# Patient Record
Sex: Female | Born: 1955 | Race: White | Hispanic: No | Marital: Married | Smoking: Never smoker
Health system: Southern US, Community
[De-identification: ages and names within clinical notes are randomized; demographics above are authoritative.]

## PROBLEM LIST (undated history)

## (undated) DIAGNOSIS — K219 Gastro-esophageal reflux disease without esophagitis: Secondary | ICD-10-CM

## (undated) DIAGNOSIS — K5909 Other constipation: Secondary | ICD-10-CM

## (undated) HISTORY — DX: Other constipation: K59.09

## (undated) HISTORY — DX: Gastro-esophageal reflux disease without esophagitis: K21.9

## (undated) HISTORY — PX: CATARACT EXTRACTION: SUR2

---

## 2012-06-23 ENCOUNTER — Encounter (INDEPENDENT_AMBULATORY_CARE_PROVIDER_SITE_OTHER): Payer: Self-pay | Admitting: *Deleted

## 2012-06-24 ENCOUNTER — Encounter (INDEPENDENT_AMBULATORY_CARE_PROVIDER_SITE_OTHER): Payer: Self-pay

## 2019-02-27 ENCOUNTER — Other Ambulatory Visit: Payer: Self-pay

## 2019-02-27 NOTE — Patient Outreach (Signed)
Health Risk Assessment- late entry Referral source: insurance plan    02/15/2019  Placed call to patient. Reviewed reason for call. Reviewed health risk survey which patient completed at time of enrollement: Patient reports that she is doing well and has no needs at this time.  PLAN: Will mail successful outreach letter Tomasa Rand, RN, BSN, CEN [facility] [phone]

## 2019-03-10 DIAGNOSIS — E782 Mixed hyperlipidemia: Secondary | ICD-10-CM | POA: Diagnosis not present

## 2019-03-10 DIAGNOSIS — K219 Gastro-esophageal reflux disease without esophagitis: Secondary | ICD-10-CM | POA: Diagnosis not present

## 2019-03-16 DIAGNOSIS — F329 Major depressive disorder, single episode, unspecified: Secondary | ICD-10-CM | POA: Diagnosis not present

## 2019-03-16 DIAGNOSIS — F411 Generalized anxiety disorder: Secondary | ICD-10-CM | POA: Diagnosis not present

## 2019-03-16 DIAGNOSIS — R300 Dysuria: Secondary | ICD-10-CM | POA: Diagnosis not present

## 2019-03-16 DIAGNOSIS — E7800 Pure hypercholesterolemia, unspecified: Secondary | ICD-10-CM | POA: Diagnosis not present

## 2019-03-16 DIAGNOSIS — Z6824 Body mass index (BMI) 24.0-24.9, adult: Secondary | ICD-10-CM | POA: Diagnosis not present

## 2019-03-16 DIAGNOSIS — E782 Mixed hyperlipidemia: Secondary | ICD-10-CM | POA: Diagnosis not present

## 2019-03-16 DIAGNOSIS — Z0000 Encounter for general adult medical examination without abnormal findings: Secondary | ICD-10-CM | POA: Diagnosis not present

## 2019-03-16 DIAGNOSIS — Z23 Encounter for immunization: Secondary | ICD-10-CM | POA: Diagnosis not present

## 2019-03-20 ENCOUNTER — Other Ambulatory Visit (HOSPITAL_COMMUNITY): Payer: Self-pay | Admitting: Family Medicine

## 2019-03-20 ENCOUNTER — Other Ambulatory Visit: Payer: Self-pay | Admitting: Family Medicine

## 2019-03-20 DIAGNOSIS — R102 Pelvic and perineal pain: Secondary | ICD-10-CM

## 2019-03-24 ENCOUNTER — Ambulatory Visit (HOSPITAL_COMMUNITY)
Admission: RE | Admit: 2019-03-24 | Discharge: 2019-03-24 | Disposition: A | Discharge status: Home / Self Care | Payer: PPO | Source: Ambulatory Visit | Attending: Family Medicine | Admitting: Family Medicine

## 2019-03-24 ENCOUNTER — Other Ambulatory Visit: Payer: Self-pay

## 2019-03-24 DIAGNOSIS — R102 Pelvic and perineal pain: Secondary | ICD-10-CM | POA: Diagnosis not present

## 2019-03-24 DIAGNOSIS — N83291 Other ovarian cyst, right side: Secondary | ICD-10-CM | POA: Diagnosis not present

## 2019-03-27 DIAGNOSIS — Z1211 Encounter for screening for malignant neoplasm of colon: Secondary | ICD-10-CM | POA: Diagnosis not present

## 2019-03-27 DIAGNOSIS — Z1212 Encounter for screening for malignant neoplasm of rectum: Secondary | ICD-10-CM | POA: Diagnosis not present

## 2019-04-05 ENCOUNTER — Other Ambulatory Visit: Payer: Self-pay

## 2019-04-05 NOTE — Patient Outreach (Addendum)
Late entry: HTA heath risk assessment/screening:  02/15/2019  See note from 02/27/2019.  Directed to add patient into HRA engagement program from Surveyor, quantity.   PLAN: mail welcome letter. Will call back in 6 months for follow up. Due 08/2019  Tomasa Rand, RN, BSN, CEN [facility] [employer] [phone]

## 2019-04-18 ENCOUNTER — Ambulatory Visit (HOSPITAL_COMMUNITY): Payer: Self-pay

## 2019-04-24 ENCOUNTER — Other Ambulatory Visit (HOSPITAL_COMMUNITY): Payer: Self-pay | Admitting: Family Medicine

## 2019-04-24 DIAGNOSIS — M810 Age-related osteoporosis without current pathological fracture: Secondary | ICD-10-CM

## 2019-05-04 ENCOUNTER — Other Ambulatory Visit: Payer: Self-pay

## 2019-05-04 NOTE — Patient Outreach (Signed)
Health Team Advantage:  Patient transitioned to [facility].  Case closed.  PLAN: close case and send MD letter.  Tomasa Rand, RN, BSN, CEN [facility] [employer] [phone]

## 2019-07-11 DIAGNOSIS — H1031 Unspecified acute conjunctivitis, right eye: Secondary | ICD-10-CM | POA: Diagnosis not present

## 2019-08-14 DIAGNOSIS — H25812 Combined forms of age-related cataract, left eye: Secondary | ICD-10-CM | POA: Diagnosis not present

## 2019-08-14 DIAGNOSIS — H2511 Age-related nuclear cataract, right eye: Secondary | ICD-10-CM | POA: Diagnosis not present

## 2019-08-17 ENCOUNTER — Ambulatory Visit: Payer: PPO

## 2019-08-18 DIAGNOSIS — H2512 Age-related nuclear cataract, left eye: Secondary | ICD-10-CM | POA: Diagnosis not present

## 2019-08-18 DIAGNOSIS — H25812 Combined forms of age-related cataract, left eye: Secondary | ICD-10-CM | POA: Diagnosis not present

## 2019-08-23 DIAGNOSIS — Z23 Encounter for immunization: Secondary | ICD-10-CM | POA: Diagnosis not present

## 2019-09-29 DIAGNOSIS — H59022 Cataract (lens) fragments in eye following cataract surgery, left eye: Secondary | ICD-10-CM | POA: Diagnosis not present

## 2019-09-29 DIAGNOSIS — H33301 Unspecified retinal break, right eye: Secondary | ICD-10-CM | POA: Diagnosis not present

## 2019-09-29 DIAGNOSIS — H2189 Other specified disorders of iris and ciliary body: Secondary | ICD-10-CM | POA: Diagnosis not present

## 2019-09-29 DIAGNOSIS — H33311 Horseshoe tear of retina without detachment, right eye: Secondary | ICD-10-CM | POA: Diagnosis not present

## 2019-09-29 DIAGNOSIS — H2511 Age-related nuclear cataract, right eye: Secondary | ICD-10-CM | POA: Diagnosis not present

## 2019-09-29 DIAGNOSIS — H25811 Combined forms of age-related cataract, right eye: Secondary | ICD-10-CM | POA: Diagnosis not present

## 2019-09-29 DIAGNOSIS — H44511 Absolute glaucoma, right eye: Secondary | ICD-10-CM | POA: Diagnosis not present

## 2019-09-29 DIAGNOSIS — H40831 Aqueous misdirection, right eye: Secondary | ICD-10-CM | POA: Diagnosis not present

## 2019-09-30 DIAGNOSIS — H4311 Vitreous hemorrhage, right eye: Secondary | ICD-10-CM | POA: Diagnosis not present

## 2019-10-02 DIAGNOSIS — H4311 Vitreous hemorrhage, right eye: Secondary | ICD-10-CM | POA: Diagnosis not present

## 2019-12-11 DIAGNOSIS — H532 Diplopia: Secondary | ICD-10-CM | POA: Diagnosis not present

## 2019-12-11 DIAGNOSIS — H2181 Floppy iris syndrome: Secondary | ICD-10-CM | POA: Diagnosis not present

## 2019-12-11 DIAGNOSIS — Z9841 Cataract extraction status, right eye: Secondary | ICD-10-CM | POA: Diagnosis not present

## 2019-12-11 DIAGNOSIS — Z961 Presence of intraocular lens: Secondary | ICD-10-CM | POA: Diagnosis not present

## 2019-12-11 DIAGNOSIS — Z9842 Cataract extraction status, left eye: Secondary | ICD-10-CM | POA: Diagnosis not present

## 2019-12-28 DIAGNOSIS — Z20822 Contact with and (suspected) exposure to covid-19: Secondary | ICD-10-CM | POA: Diagnosis not present

## 2019-12-28 DIAGNOSIS — Z01812 Encounter for preprocedural laboratory examination: Secondary | ICD-10-CM | POA: Diagnosis not present

## 2020-01-02 DIAGNOSIS — H1589 Other disorders of sclera: Secondary | ICD-10-CM | POA: Diagnosis not present

## 2020-01-02 DIAGNOSIS — T8522XA Displacement of intraocular lens, initial encounter: Secondary | ICD-10-CM | POA: Diagnosis not present

## 2020-01-02 DIAGNOSIS — Z9841 Cataract extraction status, right eye: Secondary | ICD-10-CM | POA: Diagnosis not present

## 2020-01-02 DIAGNOSIS — Z961 Presence of intraocular lens: Secondary | ICD-10-CM | POA: Diagnosis not present

## 2020-01-02 DIAGNOSIS — T85328A Displacement of other ocular prosthetic devices, implants and grafts, initial encounter: Secondary | ICD-10-CM | POA: Diagnosis not present

## 2020-01-09 IMAGING — US US PELVIS COMPLETE WITH TRANSVAGINAL
1 series · 13 of 25 positions shown · non-contrast
Comparison: None

CLINICAL DATA: Initial evaluation for pelvic pain with burning and
pressure for 4 weeks. History of prior left oophorectomy and tubal
ligation. Prior C-section.



[Series 1: us pelvis complete with transvaginal · 0.21mm/px · 13 of 114 slices shown]
[im 1/114]
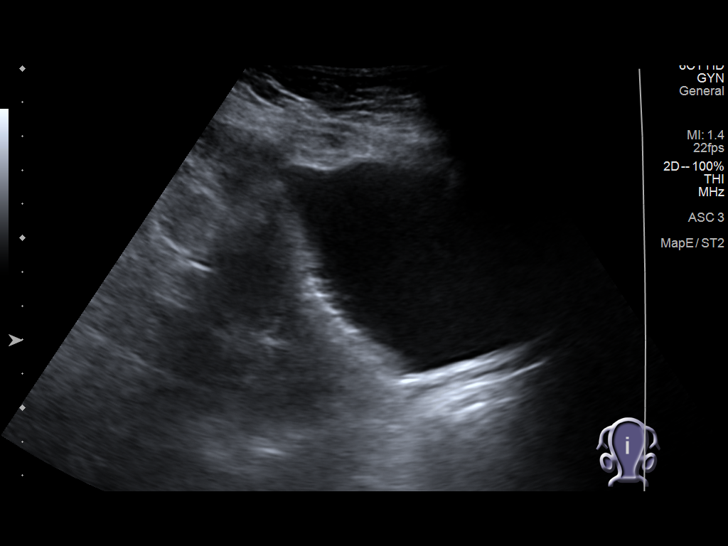
[im 10/114]
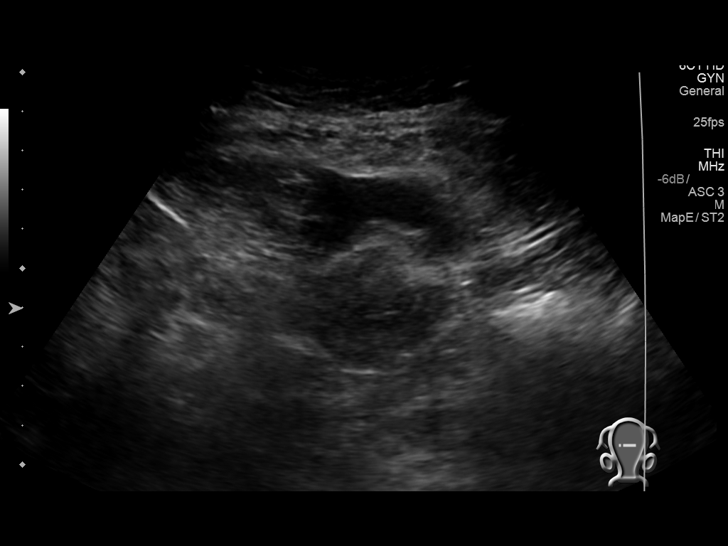
[im 19/114]
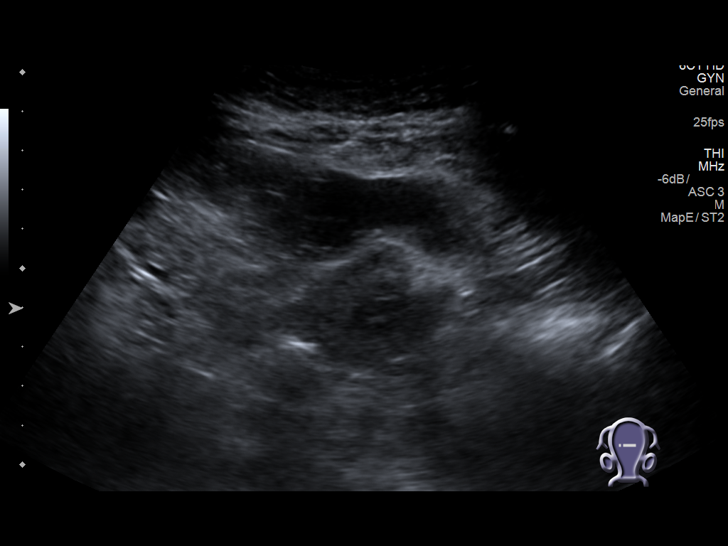
[im 29/114]
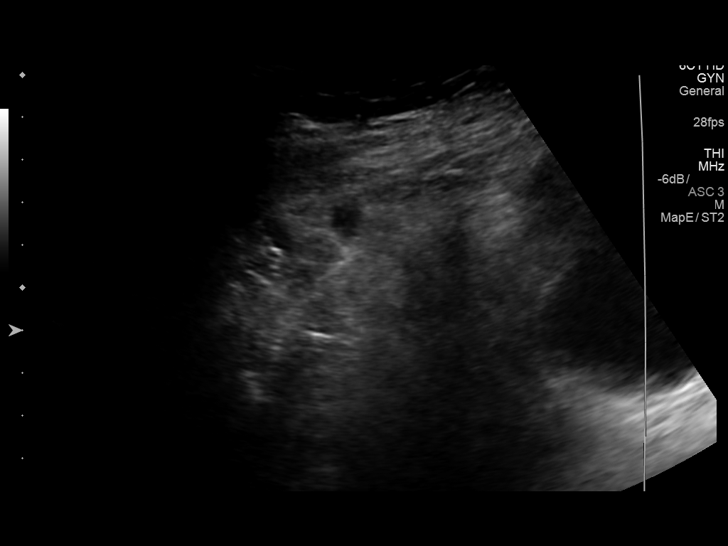
[im 38/114]
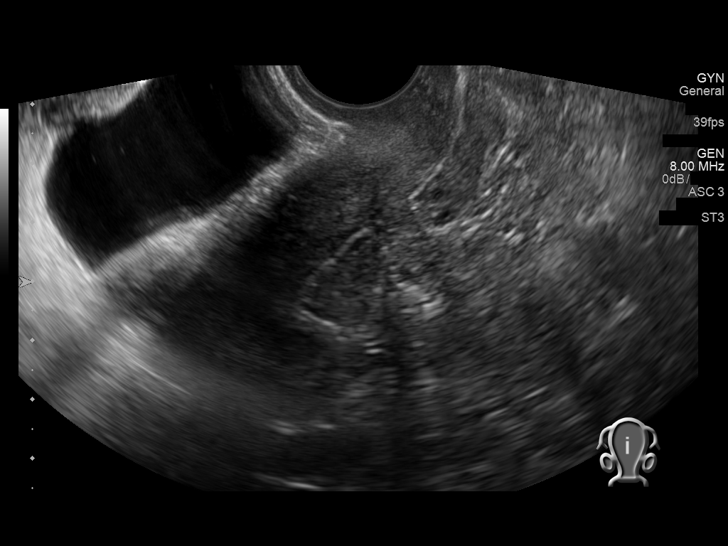
[im 48/114]
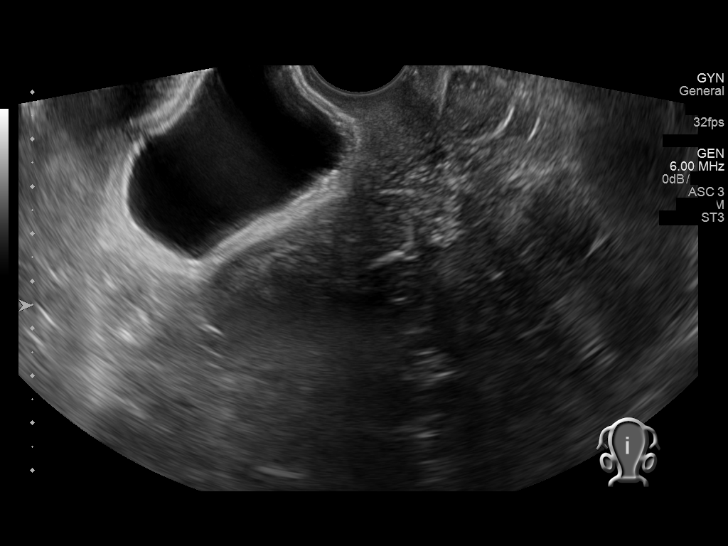
[im 57/114]
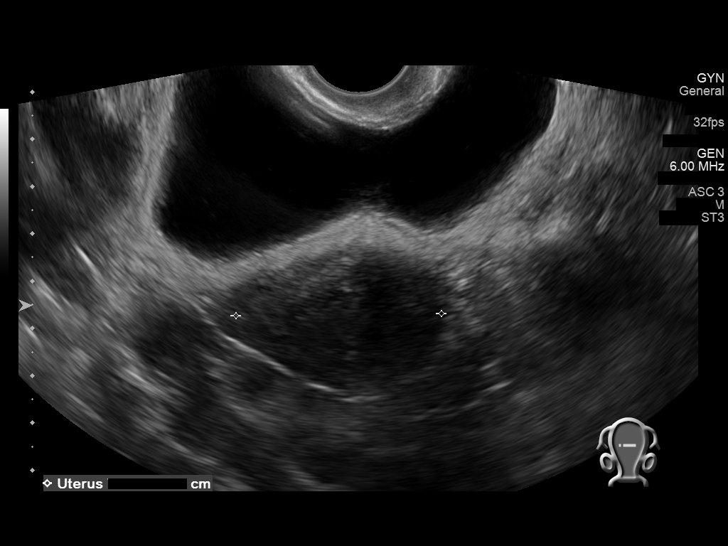
[im 66/114]
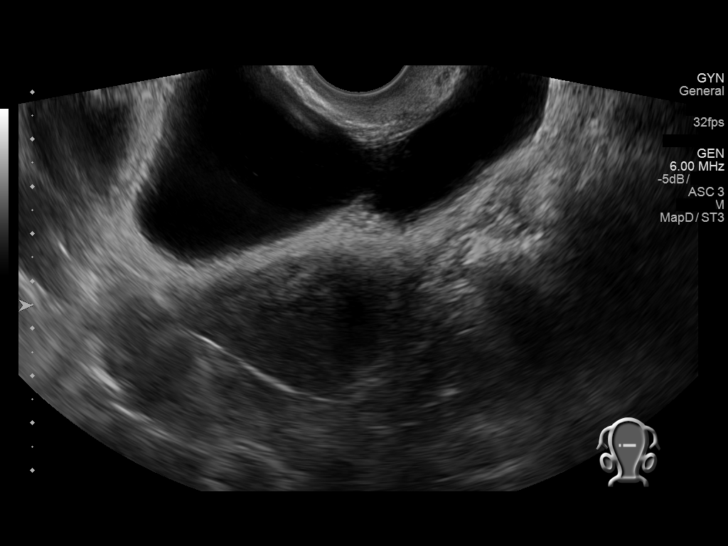
[im 76/114]
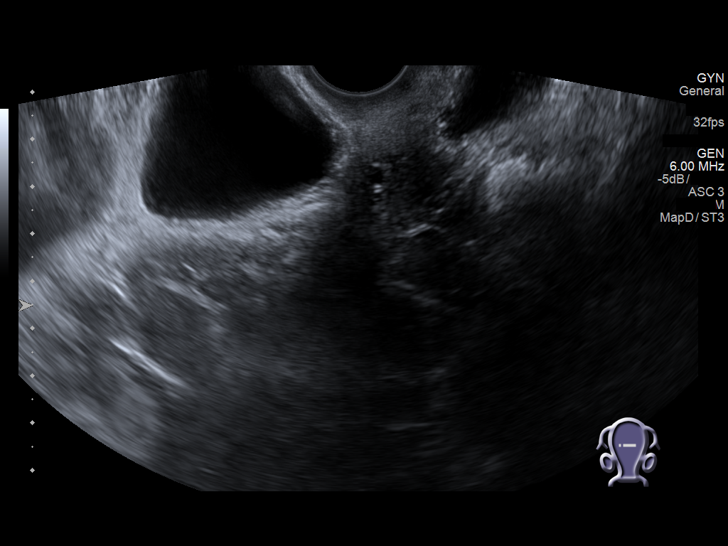
[im 85/114]
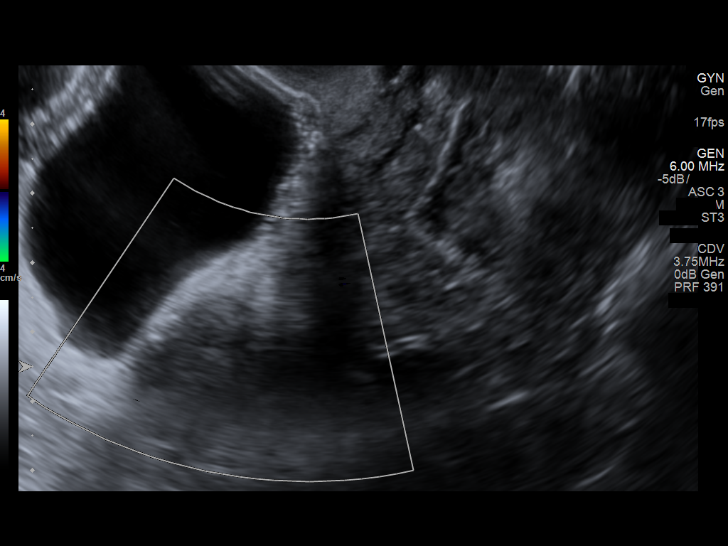
[im 95/114]
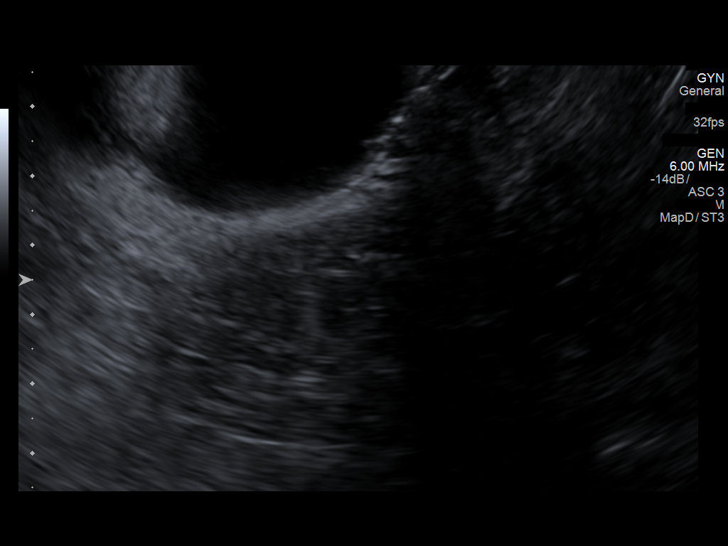
[im 104/114]
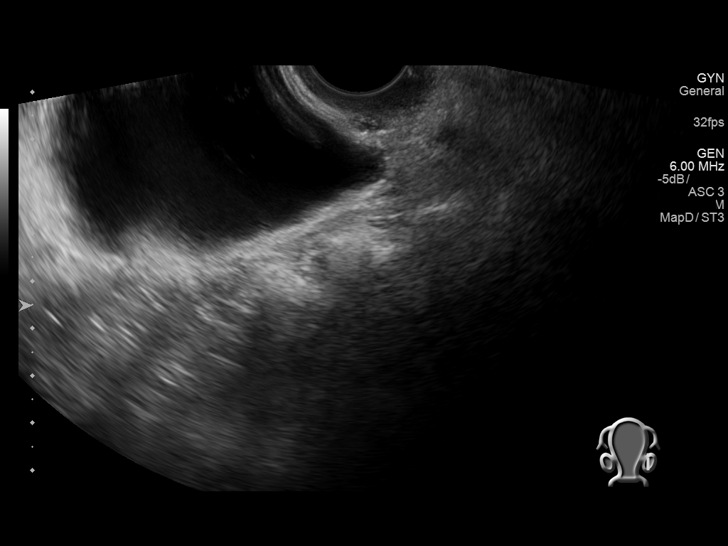
[im 114/114]
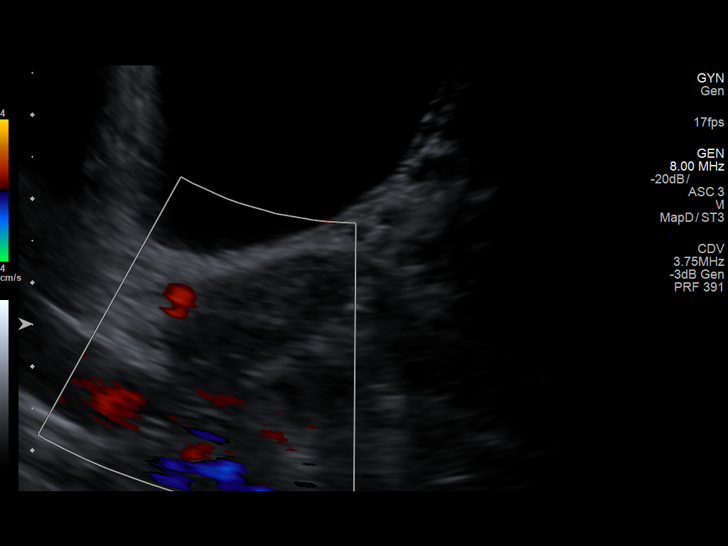

[13 of 25 positions shown; findings below may reference images not displayed]

FINDINGS: Uterus

Measurements: 6.6 x 3.3 x 4.5 cm = volume: 52.2 mL. No fibroids or
other mass visualized.

Endometrium

Thickness: 2 mm.  No focal abnormality visualized.

Right ovary

Measurements: 2.0 x 1.6 x 1.6 cm = volume: 2.6 mL. 8 x 5 x 7 mm
simple cyst. No significant internal complexity or vascularity.

Left ovary

Surgically absent and not visualized.  No adnexal mass.

Other findings

No abnormal free fluid.
IMPRESSION: 1. 8 mm simple right ovarian cyst, felt to be benign in nature given
appearance and size. No follow-up imaging recommended regarding this
lesion. No other adnexal or pelvic mass identified. No free fluid.
2. Normal sonographic appearance of the uterus and endometrium.
3. Prior left oophorectomy.

## 2020-01-10 DIAGNOSIS — Z961 Presence of intraocular lens: Secondary | ICD-10-CM | POA: Diagnosis not present

## 2020-01-10 DIAGNOSIS — Z48810 Encounter for surgical aftercare following surgery on the sense organs: Secondary | ICD-10-CM | POA: Diagnosis not present

## 2020-01-10 DIAGNOSIS — Z9889 Other specified postprocedural states: Secondary | ICD-10-CM | POA: Diagnosis not present

## 2020-01-31 DIAGNOSIS — Z9889 Other specified postprocedural states: Secondary | ICD-10-CM | POA: Diagnosis not present

## 2020-01-31 DIAGNOSIS — Z48810 Encounter for surgical aftercare following surgery on the sense organs: Secondary | ICD-10-CM | POA: Diagnosis not present

## 2020-01-31 DIAGNOSIS — Z961 Presence of intraocular lens: Secondary | ICD-10-CM | POA: Diagnosis not present

## 2020-02-28 DIAGNOSIS — Z961 Presence of intraocular lens: Secondary | ICD-10-CM | POA: Diagnosis not present

## 2020-02-28 DIAGNOSIS — Z48810 Encounter for surgical aftercare following surgery on the sense organs: Secondary | ICD-10-CM | POA: Diagnosis not present

## 2020-02-28 DIAGNOSIS — Z9889 Other specified postprocedural states: Secondary | ICD-10-CM | POA: Diagnosis not present

## 2020-02-28 DIAGNOSIS — Z9842 Cataract extraction status, left eye: Secondary | ICD-10-CM | POA: Diagnosis not present

## 2020-02-28 DIAGNOSIS — Z9841 Cataract extraction status, right eye: Secondary | ICD-10-CM | POA: Diagnosis not present

## 2020-03-04 DIAGNOSIS — L259 Unspecified contact dermatitis, unspecified cause: Secondary | ICD-10-CM | POA: Diagnosis not present

## 2020-03-08 DIAGNOSIS — R5383 Other fatigue: Secondary | ICD-10-CM | POA: Diagnosis not present

## 2020-03-08 DIAGNOSIS — R1013 Epigastric pain: Secondary | ICD-10-CM | POA: Diagnosis not present

## 2020-03-08 DIAGNOSIS — E782 Mixed hyperlipidemia: Secondary | ICD-10-CM | POA: Diagnosis not present

## 2020-03-08 DIAGNOSIS — K219 Gastro-esophageal reflux disease without esophagitis: Secondary | ICD-10-CM | POA: Diagnosis not present

## 2020-03-19 ENCOUNTER — Other Ambulatory Visit (HOSPITAL_COMMUNITY): Payer: Self-pay | Admitting: Family Medicine

## 2020-03-19 DIAGNOSIS — K219 Gastro-esophageal reflux disease without esophagitis: Secondary | ICD-10-CM | POA: Diagnosis not present

## 2020-03-19 DIAGNOSIS — Z23 Encounter for immunization: Secondary | ICD-10-CM | POA: Diagnosis not present

## 2020-03-19 DIAGNOSIS — E782 Mixed hyperlipidemia: Secondary | ICD-10-CM | POA: Diagnosis not present

## 2020-03-19 DIAGNOSIS — Z6824 Body mass index (BMI) 24.0-24.9, adult: Secondary | ICD-10-CM | POA: Diagnosis not present

## 2020-03-19 DIAGNOSIS — Z0000 Encounter for general adult medical examination without abnormal findings: Secondary | ICD-10-CM | POA: Diagnosis not present

## 2020-03-19 DIAGNOSIS — Z1231 Encounter for screening mammogram for malignant neoplasm of breast: Secondary | ICD-10-CM

## 2020-03-19 DIAGNOSIS — F411 Generalized anxiety disorder: Secondary | ICD-10-CM | POA: Diagnosis not present

## 2020-03-21 DIAGNOSIS — H35371 Puckering of macula, right eye: Secondary | ICD-10-CM | POA: Diagnosis not present

## 2020-03-21 DIAGNOSIS — H31091 Other chorioretinal scars, right eye: Secondary | ICD-10-CM | POA: Diagnosis not present

## 2020-03-21 DIAGNOSIS — H43812 Vitreous degeneration, left eye: Secondary | ICD-10-CM | POA: Diagnosis not present

## 2020-04-03 ENCOUNTER — Ambulatory Visit (HOSPITAL_COMMUNITY)
Admission: RE | Admit: 2020-04-03 | Discharge: 2020-04-03 | Disposition: A | Discharge status: Home / Self Care | Payer: PPO | Source: Ambulatory Visit | Attending: Family Medicine | Admitting: Family Medicine

## 2020-04-03 ENCOUNTER — Other Ambulatory Visit: Payer: Self-pay

## 2020-04-03 DIAGNOSIS — Z1231 Encounter for screening mammogram for malignant neoplasm of breast: Secondary | ICD-10-CM | POA: Insufficient documentation

## 2020-04-03 IMAGING — MG DIGITAL SCREENING BILAT W/ TOMO W/ CAD
8 series · 9 of 24 positions shown · non-contrast
Comparison: Previous exam(s).

CLINICAL DATA: Screening.

EXAM:
DIGITAL SCREENING BILATERAL MAMMOGRAM WITH TOMO AND CAD

[R CC synth-2D]
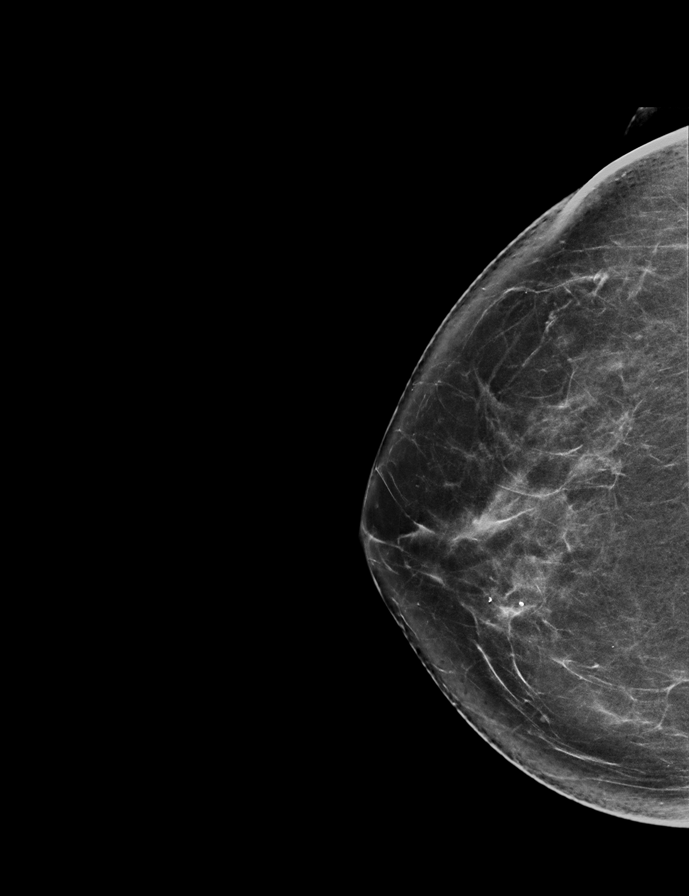

[R MLO synth-2D]
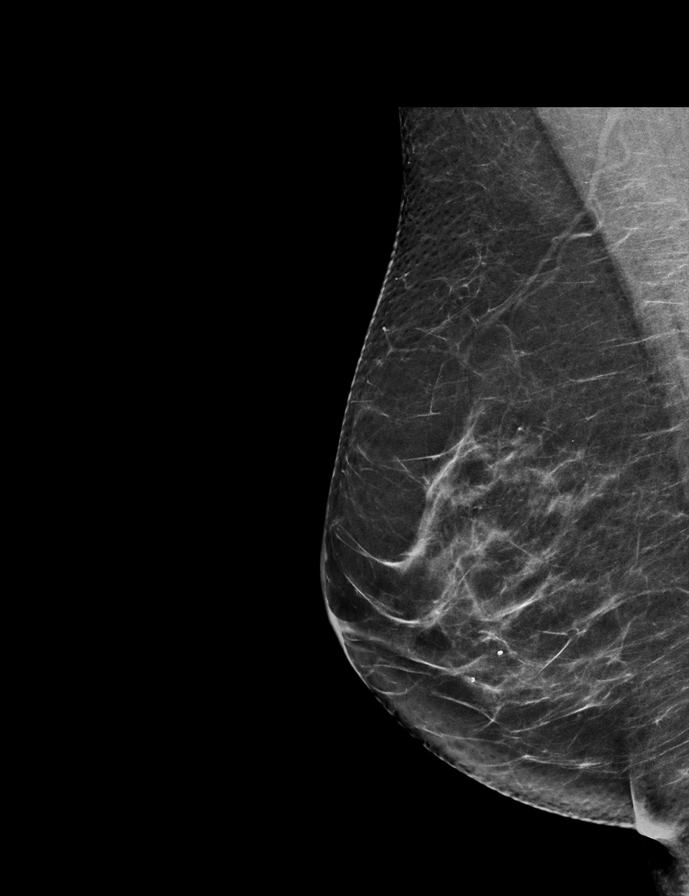

[L MLO synth-2D]
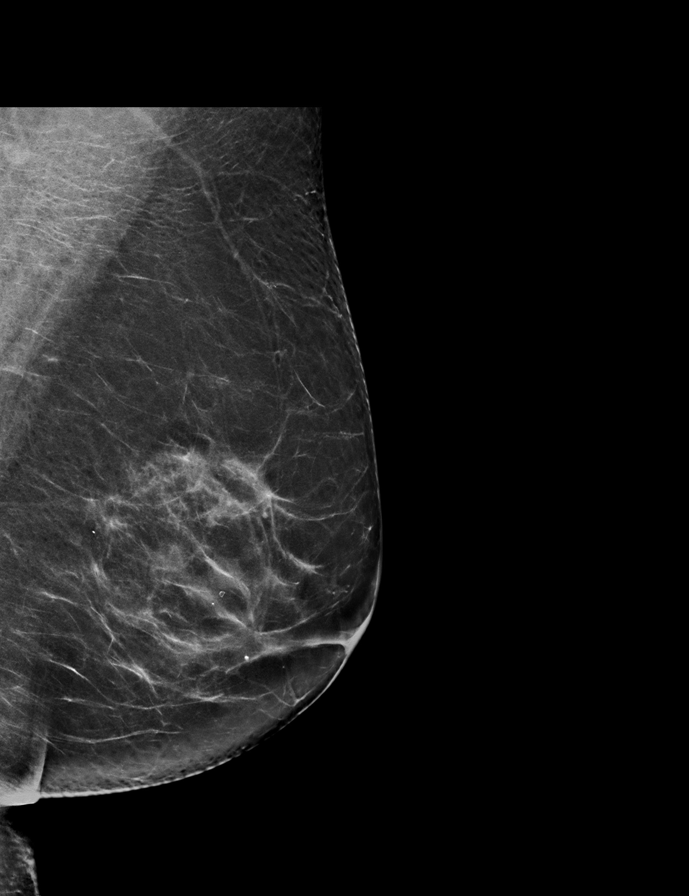

[L CC synth-2D]
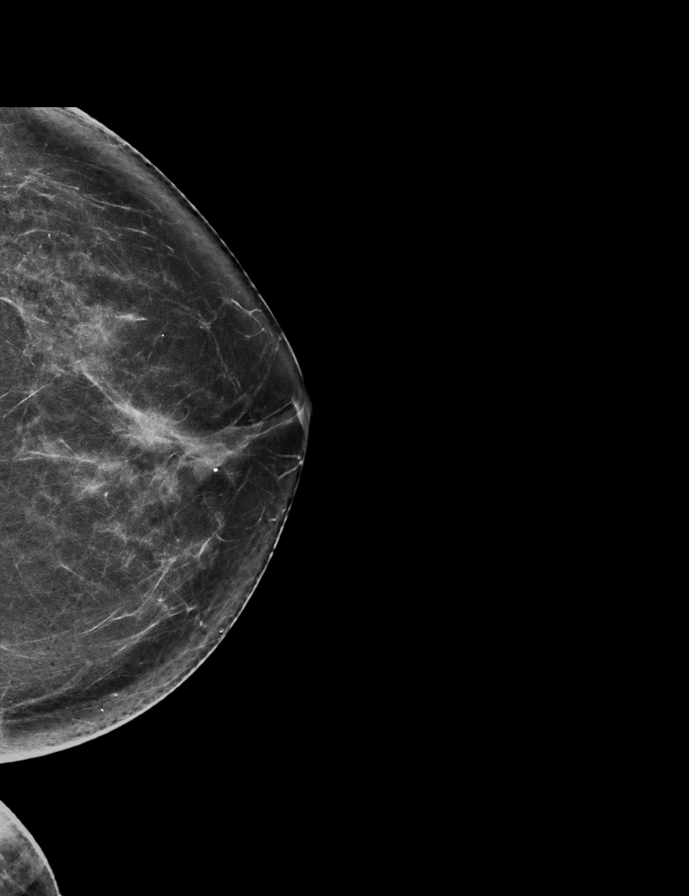

[R MLO tomo · 2 of 68 frames shown]
[frame 22/68]
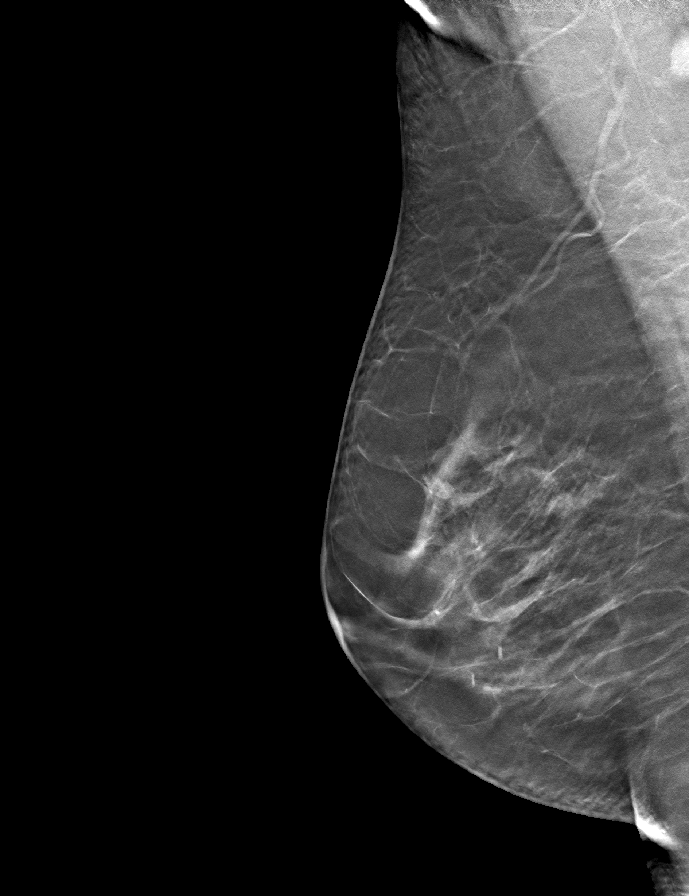
[frame 35/68]
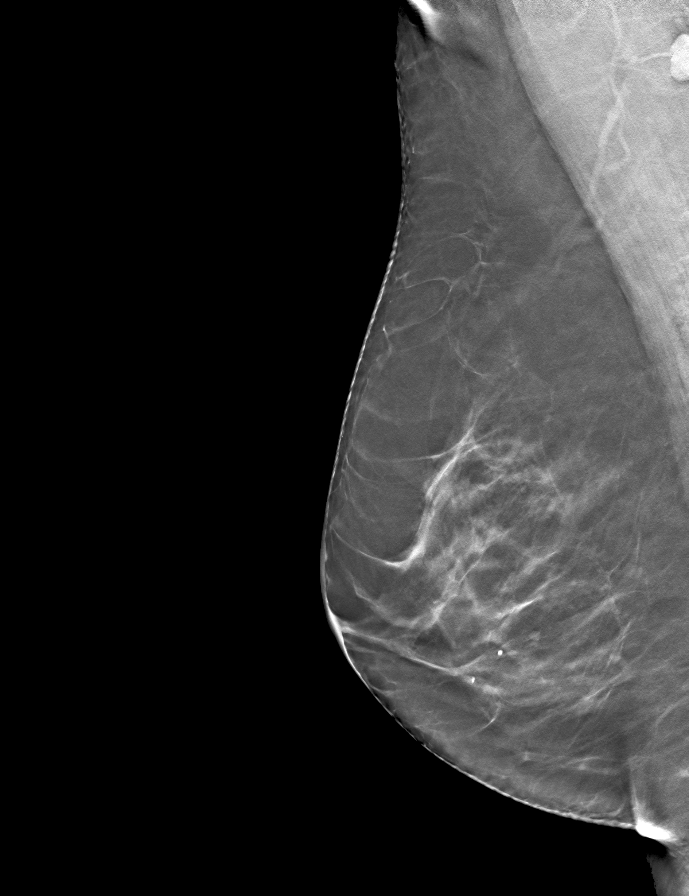

[R CC tomo · tomo slice 37/73.0]
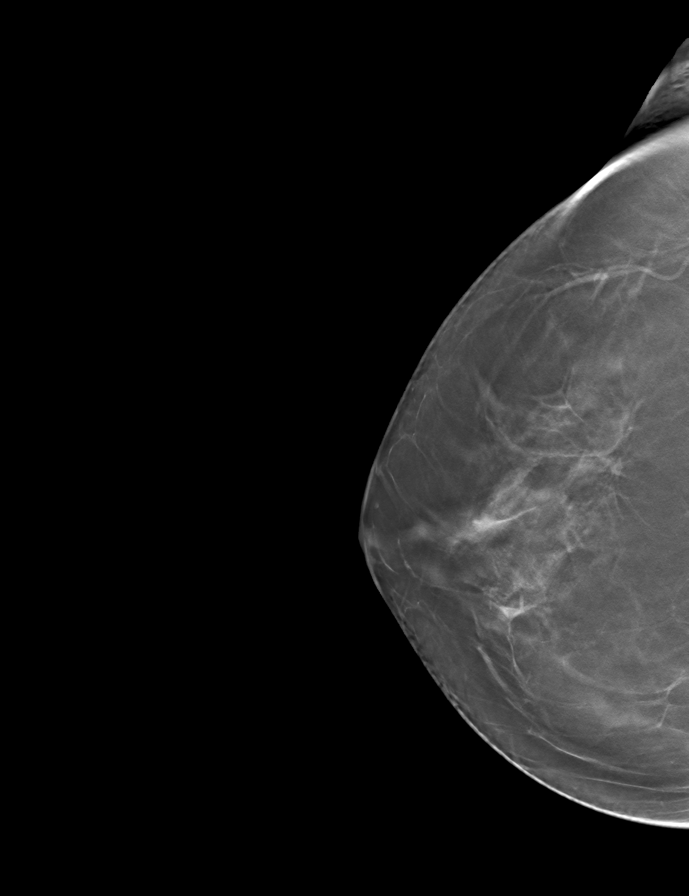

[L MLO tomo · tomo slice 37/73.0]
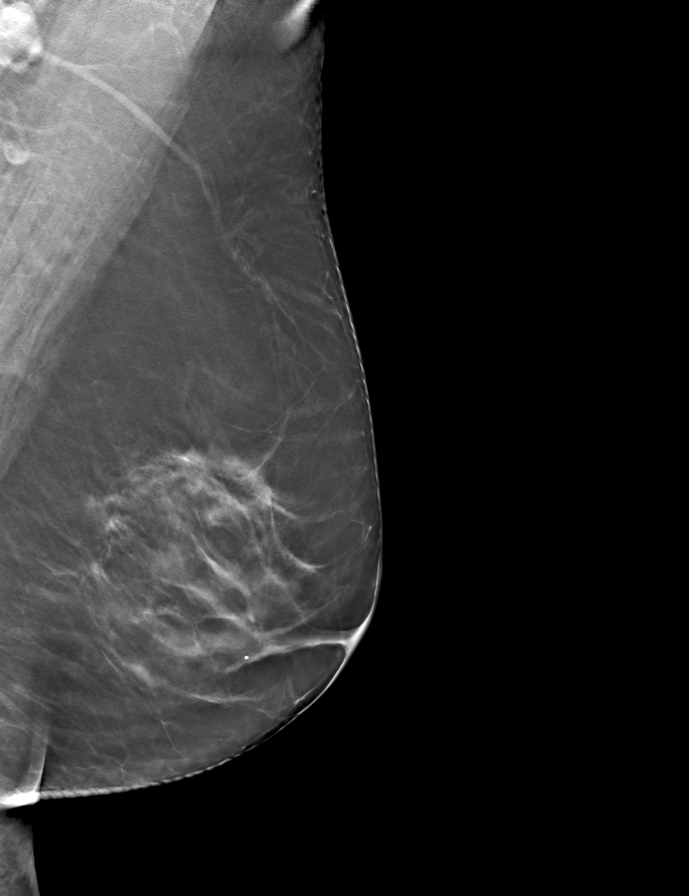

[L CC tomo · tomo slice 35/69.0]
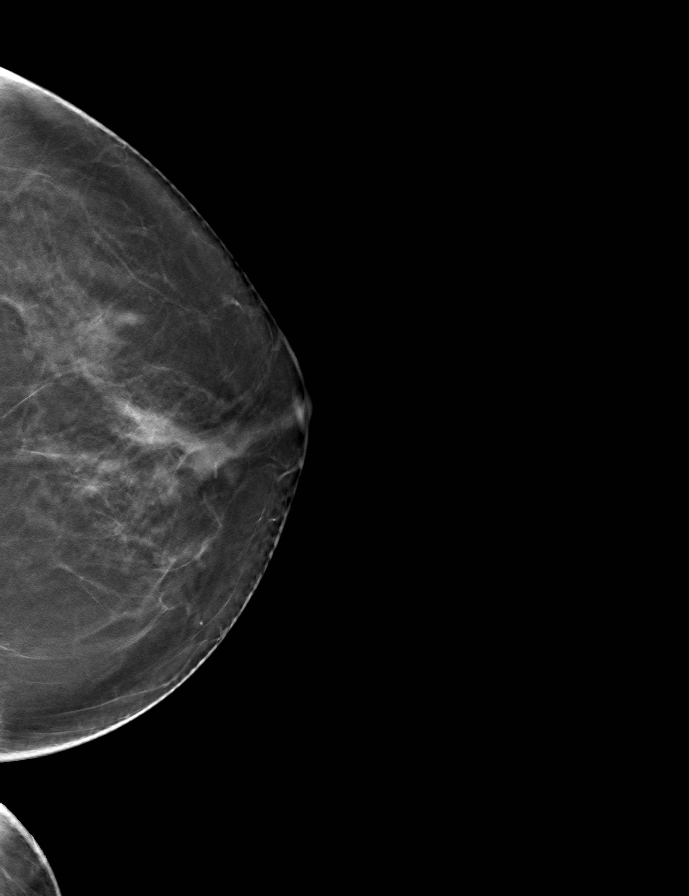

[9 of 24 positions shown; findings below may reference images not displayed]

ACR Breast Density Category b: There are scattered areas of
fibroglandular density.
FINDINGS: There are no findings suspicious for malignancy. Images were
processed with CAD.
IMPRESSION: No mammographic evidence of malignancy. A result letter of this
screening mammogram will be mailed directly to the patient.

RECOMMENDATION:
Screening mammogram in one year. (Code:[TQ])

BI-RADS CATEGORY  1: Negative.

## 2020-04-11 DIAGNOSIS — H532 Diplopia: Secondary | ICD-10-CM | POA: Diagnosis not present

## 2020-04-22 DIAGNOSIS — Z961 Presence of intraocular lens: Secondary | ICD-10-CM | POA: Diagnosis not present

## 2020-04-22 DIAGNOSIS — H2181 Floppy iris syndrome: Secondary | ICD-10-CM | POA: Diagnosis not present

## 2020-04-22 DIAGNOSIS — Z48810 Encounter for surgical aftercare following surgery on the sense organs: Secondary | ICD-10-CM | POA: Diagnosis not present

## 2020-04-22 DIAGNOSIS — Z9889 Other specified postprocedural states: Secondary | ICD-10-CM | POA: Diagnosis not present

## 2020-04-22 DIAGNOSIS — H31001 Unspecified chorioretinal scars, right eye: Secondary | ICD-10-CM | POA: Diagnosis not present

## 2020-04-22 DIAGNOSIS — H532 Diplopia: Secondary | ICD-10-CM | POA: Diagnosis not present

## 2020-05-02 DIAGNOSIS — Z961 Presence of intraocular lens: Secondary | ICD-10-CM | POA: Diagnosis not present

## 2020-05-02 DIAGNOSIS — Z9889 Other specified postprocedural states: Secondary | ICD-10-CM | POA: Diagnosis not present

## 2020-05-02 DIAGNOSIS — Z9841 Cataract extraction status, right eye: Secondary | ICD-10-CM | POA: Diagnosis not present

## 2020-05-02 DIAGNOSIS — H532 Diplopia: Secondary | ICD-10-CM | POA: Diagnosis not present

## 2020-05-02 DIAGNOSIS — Z9842 Cataract extraction status, left eye: Secondary | ICD-10-CM | POA: Diagnosis not present

## 2020-05-03 DIAGNOSIS — H2181 Floppy iris syndrome: Secondary | ICD-10-CM | POA: Diagnosis not present

## 2020-05-03 DIAGNOSIS — Z961 Presence of intraocular lens: Secondary | ICD-10-CM | POA: Diagnosis not present

## 2020-05-03 DIAGNOSIS — Z9889 Other specified postprocedural states: Secondary | ICD-10-CM | POA: Diagnosis not present

## 2020-05-03 DIAGNOSIS — H31001 Unspecified chorioretinal scars, right eye: Secondary | ICD-10-CM | POA: Diagnosis not present

## 2020-05-03 DIAGNOSIS — Z48810 Encounter for surgical aftercare following surgery on the sense organs: Secondary | ICD-10-CM | POA: Diagnosis not present

## 2020-05-08 DIAGNOSIS — Z48810 Encounter for surgical aftercare following surgery on the sense organs: Secondary | ICD-10-CM | POA: Diagnosis not present

## 2020-05-08 DIAGNOSIS — H2181 Floppy iris syndrome: Secondary | ICD-10-CM | POA: Diagnosis not present

## 2020-05-08 DIAGNOSIS — Z961 Presence of intraocular lens: Secondary | ICD-10-CM | POA: Diagnosis not present

## 2020-05-08 DIAGNOSIS — Z9889 Other specified postprocedural states: Secondary | ICD-10-CM | POA: Diagnosis not present

## 2020-06-03 DIAGNOSIS — Z48810 Encounter for surgical aftercare following surgery on the sense organs: Secondary | ICD-10-CM | POA: Diagnosis not present

## 2020-06-03 DIAGNOSIS — H019 Unspecified inflammation of eyelid: Secondary | ICD-10-CM | POA: Diagnosis not present

## 2020-06-03 DIAGNOSIS — H2181 Floppy iris syndrome: Secondary | ICD-10-CM | POA: Diagnosis not present

## 2020-06-03 DIAGNOSIS — Z9842 Cataract extraction status, left eye: Secondary | ICD-10-CM | POA: Diagnosis not present

## 2020-06-03 DIAGNOSIS — Z9841 Cataract extraction status, right eye: Secondary | ICD-10-CM | POA: Diagnosis not present

## 2020-06-03 DIAGNOSIS — Z961 Presence of intraocular lens: Secondary | ICD-10-CM | POA: Diagnosis not present

## 2020-06-03 DIAGNOSIS — Z9889 Other specified postprocedural states: Secondary | ICD-10-CM | POA: Diagnosis not present

## 2020-06-03 DIAGNOSIS — H532 Diplopia: Secondary | ICD-10-CM | POA: Diagnosis not present

## 2020-06-27 DIAGNOSIS — H20011 Primary iridocyclitis, right eye: Secondary | ICD-10-CM | POA: Diagnosis not present

## 2020-06-27 DIAGNOSIS — H35371 Puckering of macula, right eye: Secondary | ICD-10-CM | POA: Diagnosis not present

## 2020-06-27 DIAGNOSIS — H43812 Vitreous degeneration, left eye: Secondary | ICD-10-CM | POA: Diagnosis not present

## 2020-06-27 DIAGNOSIS — H35351 Cystoid macular degeneration, right eye: Secondary | ICD-10-CM | POA: Diagnosis not present

## 2020-07-01 DIAGNOSIS — Z23 Encounter for immunization: Secondary | ICD-10-CM | POA: Diagnosis not present

## 2020-07-23 DIAGNOSIS — Z961 Presence of intraocular lens: Secondary | ICD-10-CM | POA: Diagnosis not present

## 2020-07-23 DIAGNOSIS — H35351 Cystoid macular degeneration, right eye: Secondary | ICD-10-CM | POA: Diagnosis not present

## 2020-08-01 DIAGNOSIS — H43812 Vitreous degeneration, left eye: Secondary | ICD-10-CM | POA: Diagnosis not present

## 2020-08-01 DIAGNOSIS — H35351 Cystoid macular degeneration, right eye: Secondary | ICD-10-CM | POA: Diagnosis not present

## 2020-08-01 DIAGNOSIS — H35371 Puckering of macula, right eye: Secondary | ICD-10-CM | POA: Diagnosis not present

## 2020-10-31 DIAGNOSIS — H43812 Vitreous degeneration, left eye: Secondary | ICD-10-CM | POA: Diagnosis not present

## 2020-10-31 DIAGNOSIS — H35351 Cystoid macular degeneration, right eye: Secondary | ICD-10-CM | POA: Diagnosis not present

## 2020-10-31 DIAGNOSIS — H35371 Puckering of macula, right eye: Secondary | ICD-10-CM | POA: Diagnosis not present

## 2021-01-08 DIAGNOSIS — Z48810 Encounter for surgical aftercare following surgery on the sense organs: Secondary | ICD-10-CM | POA: Diagnosis not present

## 2021-01-08 DIAGNOSIS — H18891 Other specified disorders of cornea, right eye: Secondary | ICD-10-CM | POA: Diagnosis not present

## 2021-01-08 DIAGNOSIS — Z961 Presence of intraocular lens: Secondary | ICD-10-CM | POA: Diagnosis not present

## 2021-01-08 DIAGNOSIS — H2189 Other specified disorders of iris and ciliary body: Secondary | ICD-10-CM | POA: Diagnosis not present

## 2021-01-08 DIAGNOSIS — H2181 Floppy iris syndrome: Secondary | ICD-10-CM | POA: Diagnosis not present

## 2021-01-08 DIAGNOSIS — Z9889 Other specified postprocedural states: Secondary | ICD-10-CM | POA: Diagnosis not present

## 2021-01-08 DIAGNOSIS — Z9841 Cataract extraction status, right eye: Secondary | ICD-10-CM | POA: Diagnosis not present

## 2021-01-08 DIAGNOSIS — H18231 Secondary corneal edema, right eye: Secondary | ICD-10-CM | POA: Diagnosis not present

## 2021-01-08 DIAGNOSIS — H53141 Visual discomfort, right eye: Secondary | ICD-10-CM | POA: Diagnosis not present

## 2021-01-08 DIAGNOSIS — Z9842 Cataract extraction status, left eye: Secondary | ICD-10-CM | POA: Diagnosis not present

## 2021-02-06 DIAGNOSIS — H53141 Visual discomfort, right eye: Secondary | ICD-10-CM | POA: Diagnosis not present

## 2021-02-06 DIAGNOSIS — Z48810 Encounter for surgical aftercare following surgery on the sense organs: Secondary | ICD-10-CM | POA: Diagnosis not present

## 2021-02-06 DIAGNOSIS — Z9889 Other specified postprocedural states: Secondary | ICD-10-CM | POA: Diagnosis not present

## 2021-02-12 DIAGNOSIS — Z961 Presence of intraocular lens: Secondary | ICD-10-CM | POA: Diagnosis not present

## 2021-02-12 DIAGNOSIS — Z9889 Other specified postprocedural states: Secondary | ICD-10-CM | POA: Diagnosis not present

## 2021-02-12 DIAGNOSIS — Z7952 Long term (current) use of systemic steroids: Secondary | ICD-10-CM | POA: Diagnosis not present

## 2021-02-12 DIAGNOSIS — Z48810 Encounter for surgical aftercare following surgery on the sense organs: Secondary | ICD-10-CM | POA: Diagnosis not present

## 2021-02-13 DIAGNOSIS — H43812 Vitreous degeneration, left eye: Secondary | ICD-10-CM | POA: Diagnosis not present

## 2021-02-13 DIAGNOSIS — H35351 Cystoid macular degeneration, right eye: Secondary | ICD-10-CM | POA: Diagnosis not present

## 2021-02-13 DIAGNOSIS — H35371 Puckering of macula, right eye: Secondary | ICD-10-CM | POA: Diagnosis not present

## 2021-02-17 DIAGNOSIS — Z48810 Encounter for surgical aftercare following surgery on the sense organs: Secondary | ICD-10-CM | POA: Diagnosis not present

## 2021-02-17 DIAGNOSIS — Z961 Presence of intraocular lens: Secondary | ICD-10-CM | POA: Diagnosis not present

## 2021-02-17 DIAGNOSIS — Z9889 Other specified postprocedural states: Secondary | ICD-10-CM | POA: Diagnosis not present

## 2021-02-17 DIAGNOSIS — Z79899 Other long term (current) drug therapy: Secondary | ICD-10-CM | POA: Diagnosis not present

## 2021-02-24 DIAGNOSIS — Z48810 Encounter for surgical aftercare following surgery on the sense organs: Secondary | ICD-10-CM | POA: Diagnosis not present

## 2021-02-24 DIAGNOSIS — Z961 Presence of intraocular lens: Secondary | ICD-10-CM | POA: Diagnosis not present

## 2021-03-17 ENCOUNTER — Other Ambulatory Visit (HOSPITAL_COMMUNITY): Payer: Self-pay | Admitting: Family Medicine

## 2021-03-17 DIAGNOSIS — Z1231 Encounter for screening mammogram for malignant neoplasm of breast: Secondary | ICD-10-CM

## 2021-04-07 ENCOUNTER — Ambulatory Visit (HOSPITAL_COMMUNITY)
Admission: RE | Admit: 2021-04-07 | Discharge: 2021-04-07 | Disposition: A | Discharge status: Home / Self Care | Payer: PPO | Source: Ambulatory Visit | Attending: Family Medicine | Admitting: Family Medicine

## 2021-04-07 DIAGNOSIS — Z1231 Encounter for screening mammogram for malignant neoplasm of breast: Secondary | ICD-10-CM | POA: Insufficient documentation

## 2021-04-07 IMAGING — MG MM DIGITAL SCREENING BILAT W/ TOMO AND CAD
8 series · 9 of 24 positions shown · non-contrast
Comparison: Previous exam(s).

CLINICAL DATA: Screening.

EXAM:
DIGITAL SCREENING BILATERAL MAMMOGRAM WITH TOMOSYNTHESIS AND CAD
TECHNIQUE: Bilateral screening digital craniocaudal and mediolateral oblique
mammograms were obtained. Bilateral screening digital breast
tomosynthesis was performed. The images were evaluated with
computer-aided detection.

[R CC synth-2D]
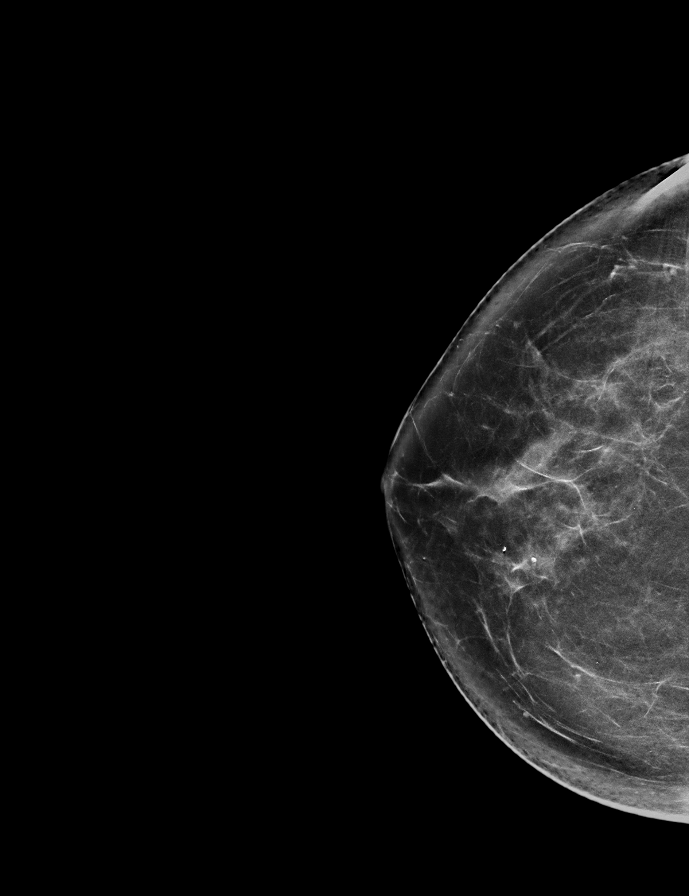

[L MLO synth-2D]
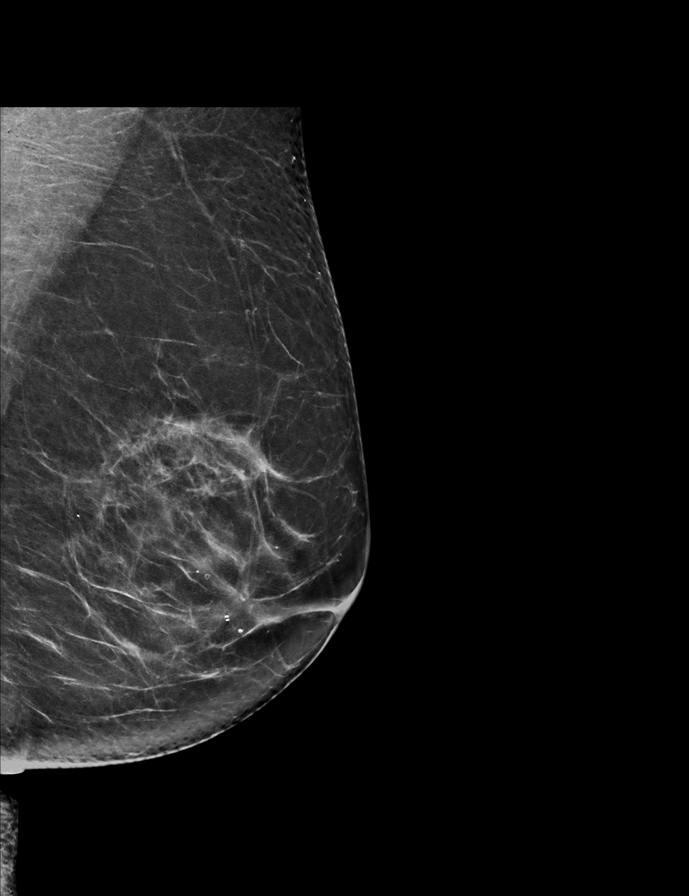

[R MLO synth-2D]
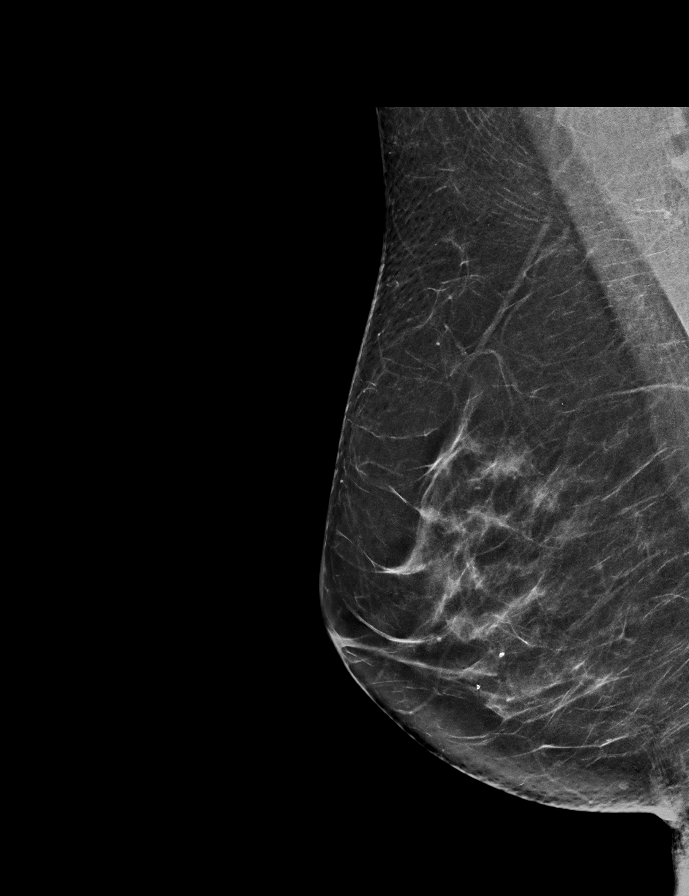

[L CC synth-2D]
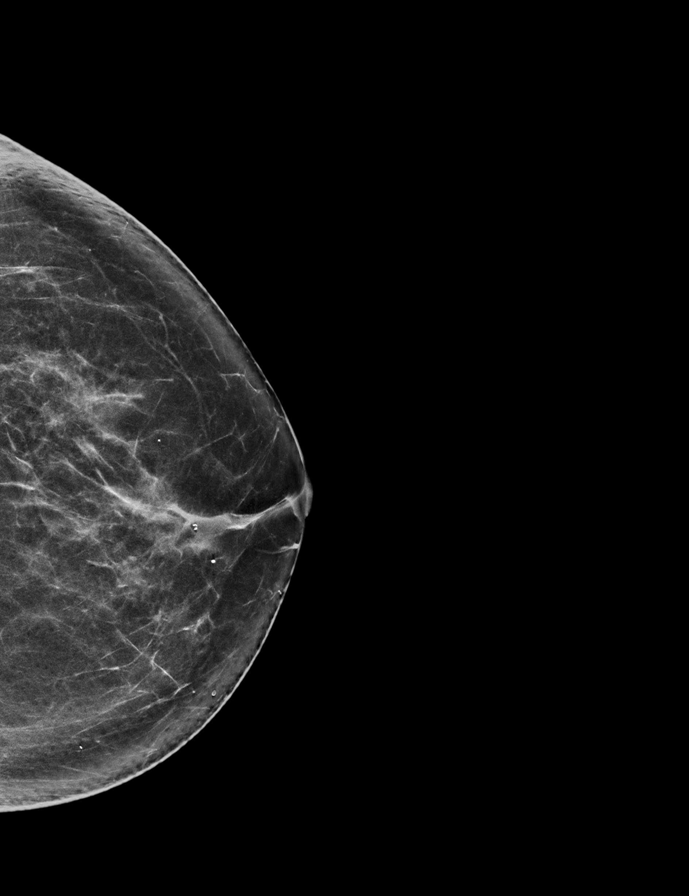

[L MLO tomo · 2 of 71 frames shown]
[frame 23/71]
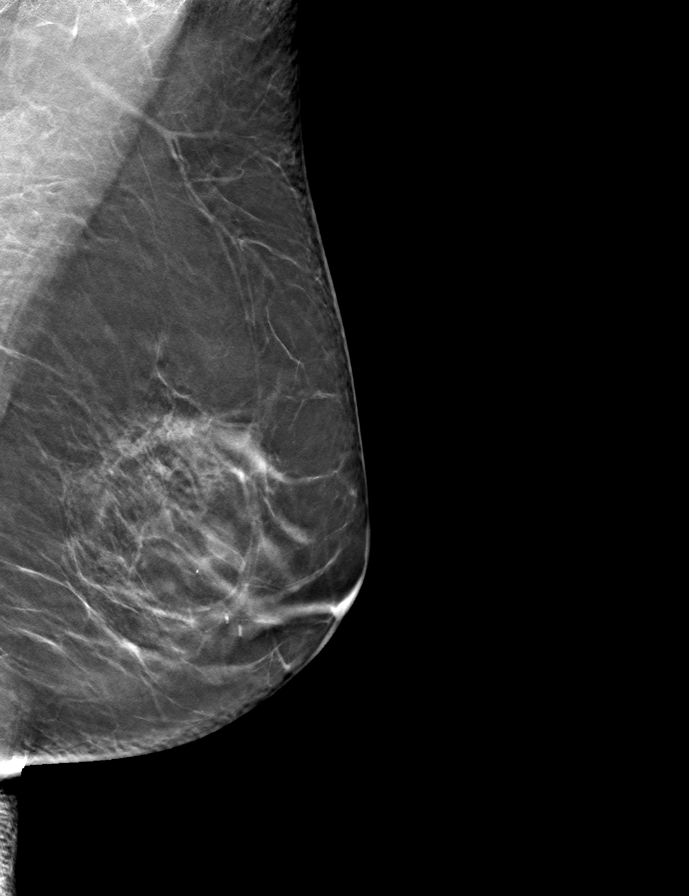
[frame 36/71]
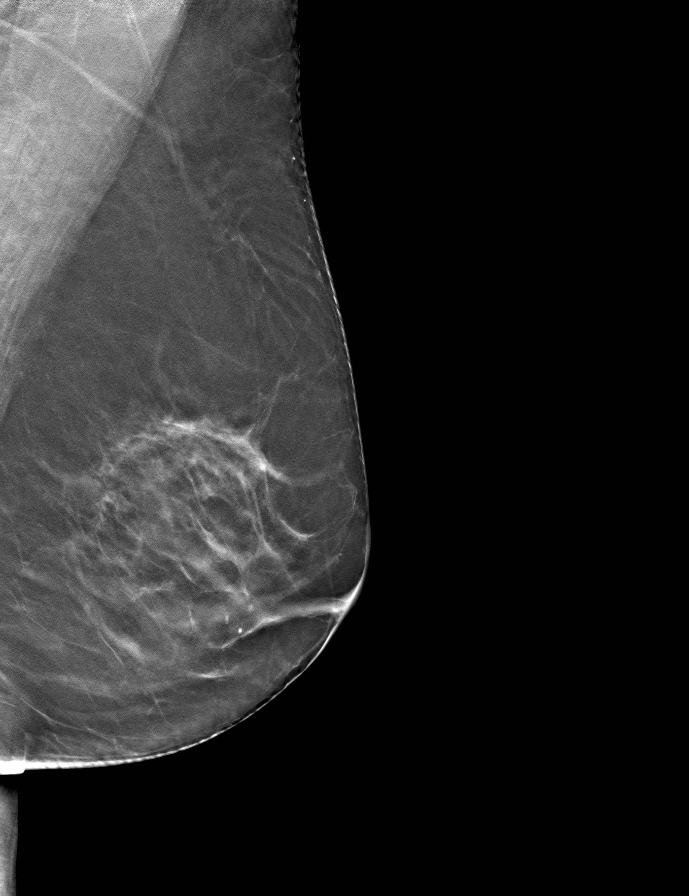

[L CC tomo · tomo slice 35/69.0]
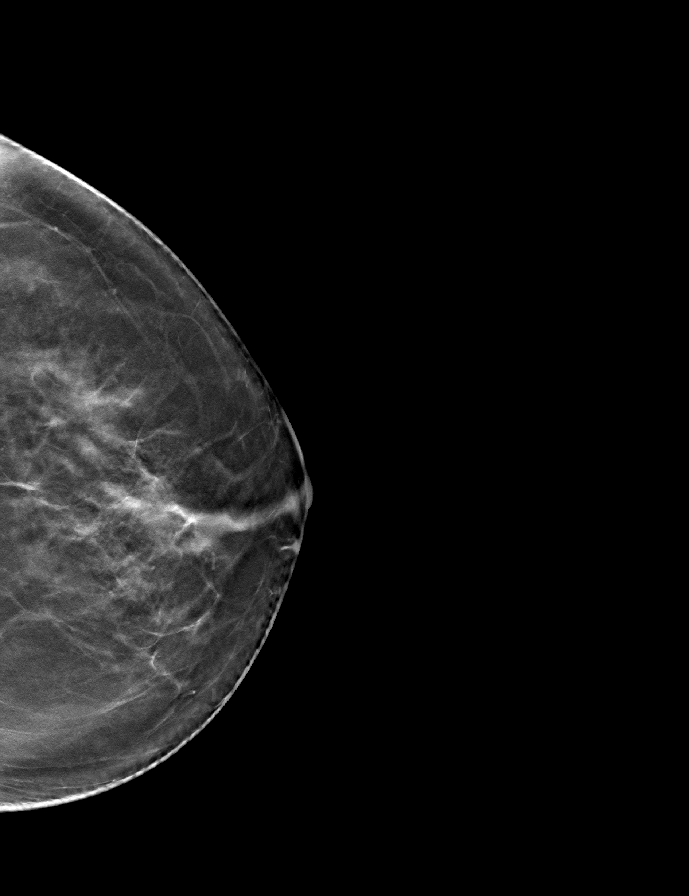

[R CC tomo · tomo slice 38/75.0]
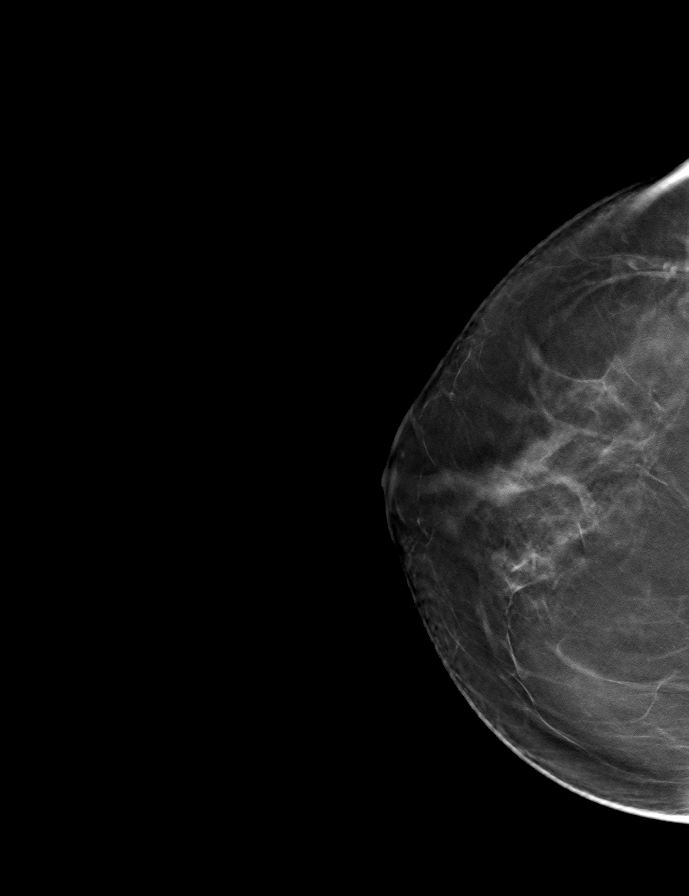

[R MLO tomo · tomo slice 37/73.0]
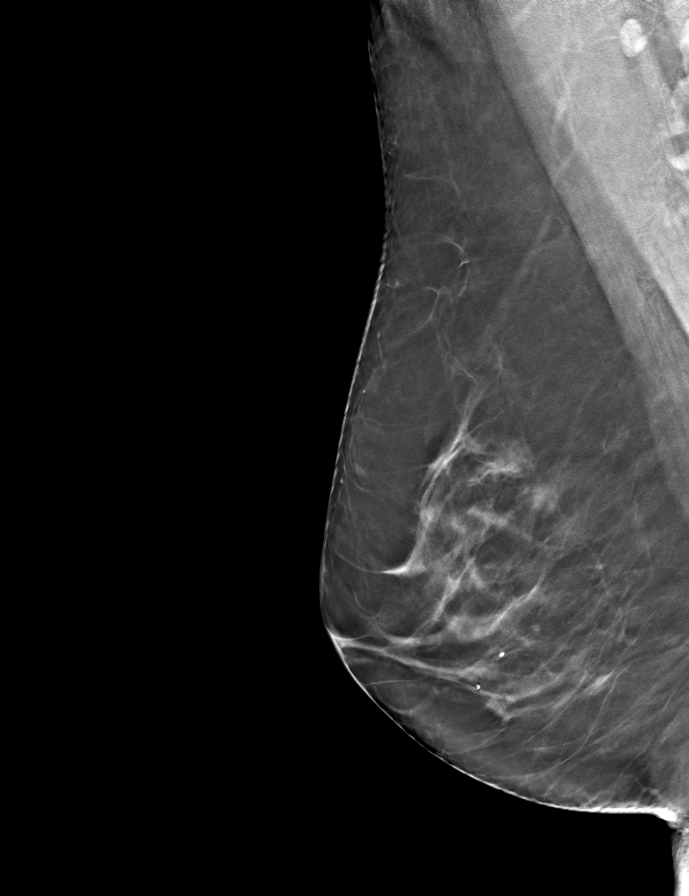

[9 of 24 positions shown; findings below may reference images not displayed]

ACR Breast Density Category b: There are scattered areas of
fibroglandular density.
FINDINGS: There are no findings suspicious for malignancy. The images were
evaluated with computer-aided detection.
IMPRESSION: No mammographic evidence of malignancy. A result letter of this
screening mammogram will be mailed directly to the patient.

RECOMMENDATION:
Screening mammogram in one year. (Code:[OD])

BI-RADS CATEGORY  1: Negative.

## 2021-04-17 DIAGNOSIS — R5383 Other fatigue: Secondary | ICD-10-CM | POA: Diagnosis not present

## 2021-04-17 DIAGNOSIS — Z1329 Encounter for screening for other suspected endocrine disorder: Secondary | ICD-10-CM | POA: Diagnosis not present

## 2021-04-17 DIAGNOSIS — Z20828 Contact with and (suspected) exposure to other viral communicable diseases: Secondary | ICD-10-CM | POA: Diagnosis not present

## 2021-04-17 DIAGNOSIS — E782 Mixed hyperlipidemia: Secondary | ICD-10-CM | POA: Diagnosis not present

## 2021-04-22 DIAGNOSIS — M25552 Pain in left hip: Secondary | ICD-10-CM | POA: Diagnosis not present

## 2021-04-22 DIAGNOSIS — F411 Generalized anxiety disorder: Secondary | ICD-10-CM | POA: Diagnosis not present

## 2021-04-22 DIAGNOSIS — Z0000 Encounter for general adult medical examination without abnormal findings: Secondary | ICD-10-CM | POA: Diagnosis not present

## 2021-04-22 DIAGNOSIS — Z20828 Contact with and (suspected) exposure to other viral communicable diseases: Secondary | ICD-10-CM | POA: Diagnosis not present

## 2021-04-22 DIAGNOSIS — K219 Gastro-esophageal reflux disease without esophagitis: Secondary | ICD-10-CM | POA: Diagnosis not present

## 2021-04-22 DIAGNOSIS — Z6824 Body mass index (BMI) 24.0-24.9, adult: Secondary | ICD-10-CM | POA: Diagnosis not present

## 2021-05-15 DIAGNOSIS — H35351 Cystoid macular degeneration, right eye: Secondary | ICD-10-CM | POA: Diagnosis not present

## 2021-05-15 DIAGNOSIS — H31091 Other chorioretinal scars, right eye: Secondary | ICD-10-CM | POA: Diagnosis not present

## 2021-05-15 DIAGNOSIS — H43812 Vitreous degeneration, left eye: Secondary | ICD-10-CM | POA: Diagnosis not present

## 2021-05-15 DIAGNOSIS — H35371 Puckering of macula, right eye: Secondary | ICD-10-CM | POA: Diagnosis not present

## 2021-05-29 ENCOUNTER — Other Ambulatory Visit: Payer: Self-pay

## 2021-05-29 ENCOUNTER — Ambulatory Visit (INDEPENDENT_AMBULATORY_CARE_PROVIDER_SITE_OTHER): Payer: PPO | Admitting: Orthopaedic Surgery

## 2021-05-29 ENCOUNTER — Ambulatory Visit (INDEPENDENT_AMBULATORY_CARE_PROVIDER_SITE_OTHER): Payer: PPO

## 2021-05-29 ENCOUNTER — Encounter: Payer: Self-pay | Admitting: Orthopaedic Surgery

## 2021-05-29 VITALS — Ht 64.0 in | Wt 136.0 lb

## 2021-05-29 DIAGNOSIS — M4726 Other spondylosis with radiculopathy, lumbar region: Secondary | ICD-10-CM | POA: Diagnosis not present

## 2021-05-29 DIAGNOSIS — M545 Low back pain, unspecified: Secondary | ICD-10-CM

## 2021-05-29 DIAGNOSIS — M25552 Pain in left hip: Secondary | ICD-10-CM

## 2021-05-29 DIAGNOSIS — M25559 Pain in unspecified hip: Secondary | ICD-10-CM

## 2021-05-29 NOTE — Progress Notes (Signed)
Office Visit Note   Patient: Lindsay Wilson           Date of Birth: Sep 03, 1956           MRN: [id number] Visit Date: 05/29/2021              Requested by: Curlene Labrum, MD [place],  [place] [id number] PCP: Curlene Labrum, MD   Assessment & Plan: Visit Diagnoses:  1. Hip pain   2. Acute midline low back pain without sciatica   3. Other spondylosis with radiculopathy, lumbar region     Plan: We will set patient up for some physical therapy recheck her in 5 weeks.  Recheck 5 weeks if she has persistent problems we will consider diagnostic MRI imaging.  Follow-Up Instructions: Return in about 5 weeks (around 07/03/2021).   Orders:  Orders Placed This Encounter  Procedures   XR Lumbar Spine 2-3 Views   Ambulatory referral to Physical Therapy   No orders of the defined types were placed in this encounter.     Procedures: No procedures performed   Clinical Data: No additional findings.   Subjective: Chief Complaint  Patient presents with   Left Hip - Pain    Left side radiculopathy, chronic pain. Lt hip has been hurting over the last 3 months. Tried OTC meds, did a Prednisone dose pack it gave some relief.    HPI 65 year old female with chronic buttocks pain that radiates into the left thigh.  Pains been present for greater than a year.  No bowel or bladder associated symptoms.  Been worse the last 3 months.  She took prednisone Dosepak for 10 days which helped less than 50%.  No past history of injury.  She denies fever or chills.  She was referred by [name] PA for left hip pain.  Review of Systems 14 point review of systems positive for acid reflux history of anxiety cataracts pneumonia.  She takes medication for cholesterol.  She does not smoke negative for stroke or MI.   Objective: Vital Signs: Ht 5\' 4"  (1.626 m)   Wt 136 lb (61.7 kg)   BMI 23.34 kg/m   Physical Exam Constitutional:      Appearance: She is well-developed.  HENT:     Head:  Normocephalic.     Right Ear: External ear normal.     Left Ear: External ear normal. There is no impacted cerumen.  Eyes:     Pupils: Pupils are equal, round, and reactive to light.  Neck:     Thyroid: No thyromegaly.     Trachea: No tracheal deviation.  Cardiovascular:     Rate and Rhythm: Normal rate.  Pulmonary:     Effort: Pulmonary effort is normal.  Abdominal:     Palpations: Abdomen is soft.  Musculoskeletal:     Cervical back: No rigidity.  Skin:    General: Skin is warm and dry.  Neurological:     Mental Status: She is alert and oriented to person, place, and time.  Psychiatric:        Behavior: Behavior normal.    Ortho Exam patient has some discomfort with straight leg raising 80 degrees on the left sciatic notch tenderness some trochanteric bursal tenderness negative logroll the hips.  Knee and ankle jerk are intact.  Anterior tib gastrocsoleus is normal she has some low back pain with toe walking and heel walking but no weakness.  EHL toe extensors are normal.  Peroneals posterior  tib are normal.  Pedal pulses are normal.  Some tenderness lumbosacral junction.  Skin over the lumbar spine is normal.  Specialty Comments:  No specialty comments available.  Imaging: XR Lumbar Spine 2-3 Views  Result Date: 05/29/2021 AP lateral lumbar spine x-rays are obtained and reviewed this shows disc base narrowing at L4-5 grade 1 anterolisthesis with facet arthropathy and endplate spurring. Impression: L4-5 degenerative anterolisthesis.  Otherwise no acute changes.    PMFS History: Patient Active Problem List   Diagnosis Date Noted   Other spondylosis with radiculopathy, lumbar region 05/29/2021    No past medical history on file.  No family history on file.   Social History   Occupational History   Not on file  Tobacco Use   Smoking status: Never    Passive exposure: Never   Smokeless tobacco: Never  Substance and Sexual Activity   Alcohol use: Not on file   Drug  use: Not on file   Sexual activity: Not on file

## 2021-05-30 DIAGNOSIS — M4316 Spondylolisthesis, lumbar region: Secondary | ICD-10-CM | POA: Diagnosis not present

## 2021-06-05 DIAGNOSIS — M4316 Spondylolisthesis, lumbar region: Secondary | ICD-10-CM | POA: Diagnosis not present

## 2021-06-12 DIAGNOSIS — M4316 Spondylolisthesis, lumbar region: Secondary | ICD-10-CM | POA: Diagnosis not present

## 2021-06-19 DIAGNOSIS — M4316 Spondylolisthesis, lumbar region: Secondary | ICD-10-CM | POA: Diagnosis not present

## 2021-07-10 ENCOUNTER — Ambulatory Visit: Payer: PPO | Admitting: Orthopaedic Surgery

## 2021-09-05 ENCOUNTER — Other Ambulatory Visit: Payer: Self-pay | Admitting: Orthopedic Surgery

## 2021-09-05 ENCOUNTER — Other Ambulatory Visit (HOSPITAL_COMMUNITY): Payer: Self-pay | Admitting: Orthopedic Surgery

## 2021-09-05 DIAGNOSIS — M25552 Pain in left hip: Secondary | ICD-10-CM | POA: Diagnosis not present

## 2021-09-05 DIAGNOSIS — M8700 Idiopathic aseptic necrosis of unspecified bone: Secondary | ICD-10-CM

## 2021-09-12 DIAGNOSIS — H43822 Vitreomacular adhesion, left eye: Secondary | ICD-10-CM | POA: Diagnosis not present

## 2021-09-12 DIAGNOSIS — H31091 Other chorioretinal scars, right eye: Secondary | ICD-10-CM | POA: Diagnosis not present

## 2021-09-12 DIAGNOSIS — H35371 Puckering of macula, right eye: Secondary | ICD-10-CM | POA: Diagnosis not present

## 2021-09-15 ENCOUNTER — Ambulatory Visit (HOSPITAL_COMMUNITY)
Admission: RE | Admit: 2021-09-15 | Discharge: 2021-09-15 | Disposition: A | Discharge status: Home / Self Care | Payer: PPO | Source: Ambulatory Visit | Attending: Orthopedic Surgery | Admitting: Orthopedic Surgery

## 2021-09-15 ENCOUNTER — Other Ambulatory Visit: Payer: Self-pay

## 2021-09-15 DIAGNOSIS — S76312A Strain of muscle, fascia and tendon of the posterior muscle group at thigh level, left thigh, initial encounter: Secondary | ICD-10-CM | POA: Diagnosis not present

## 2021-09-15 DIAGNOSIS — M8700 Idiopathic aseptic necrosis of unspecified bone: Secondary | ICD-10-CM

## 2021-09-15 DIAGNOSIS — S73192A Other sprain of left hip, initial encounter: Secondary | ICD-10-CM | POA: Diagnosis not present

## 2021-09-15 DIAGNOSIS — D259 Leiomyoma of uterus, unspecified: Secondary | ICD-10-CM | POA: Diagnosis not present

## 2021-09-15 IMAGING — MR MR HIP*L* W/O CM
5 of 6 series · 17 of 40 positions shown · non-contrast
Comparison: Left hip x-rays dated [DATE].

CLINICAL DATA: Left hip pain for 2 months.

EXAM:
MR OF THE LEFT HIP WITHOUT CONTRAST
TECHNIQUE: Multiplanar, multisequence MR imaging was performed. No intravenous
contrast was administered.

[Series 4: T1 · coronal · left · 4.0mm · 0.72mm/px · 4 of 30 slices shown]
[im 1/30]
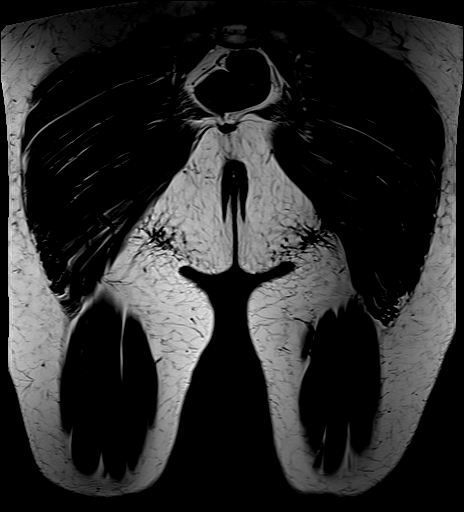
[im 10/30]
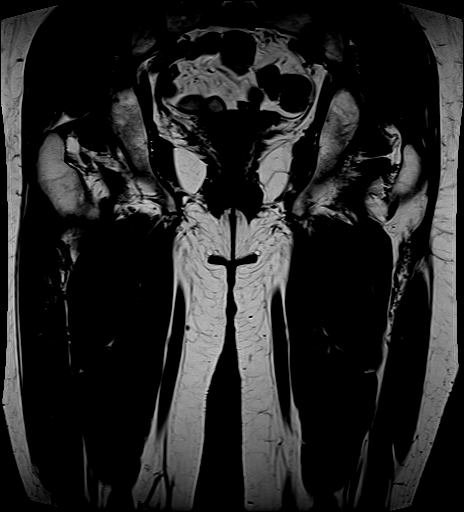
[im 20/30]
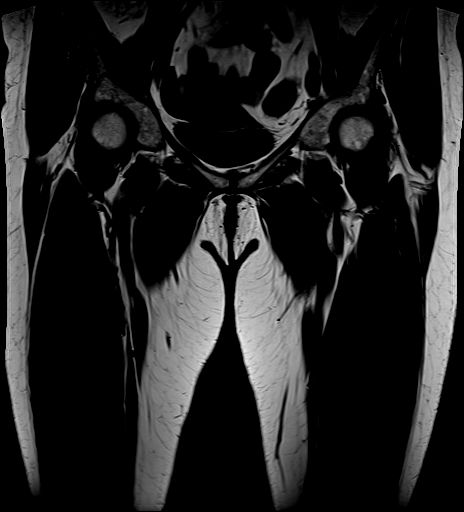
[im 30/30]
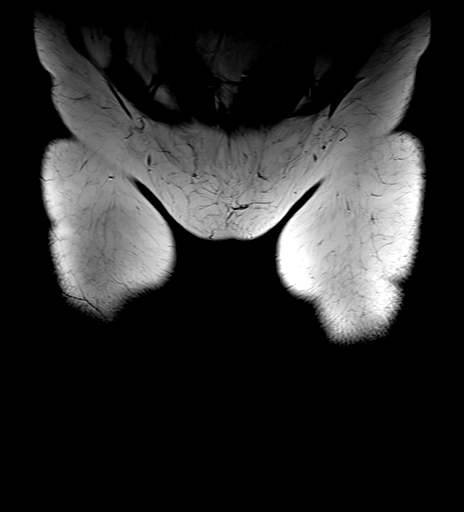

[Series 5: STIR · coronal · left · 4.0mm · 0.99mm/px · 4 of 30 slices shown]
[im 1/30]
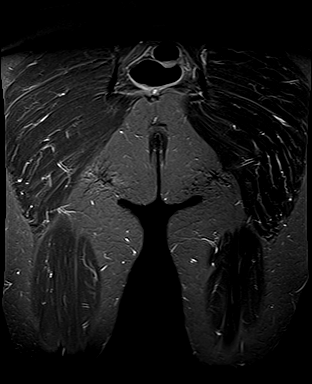
[im 10/30]
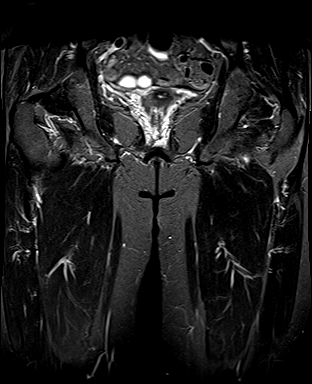
[im 20/30]
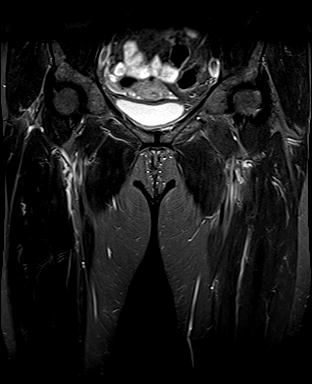
[im 30/30]
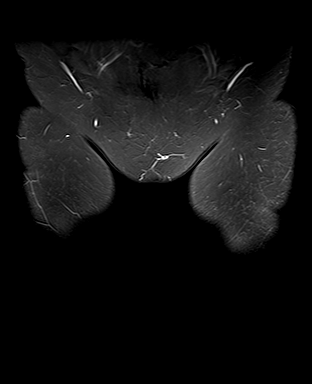

[Series 6: T2 fat-sat · axial · left · 4.0mm · 1.61mm/px · z∈[-191,-21]mm · 4 of 35 slices shown]
[im 1/35]
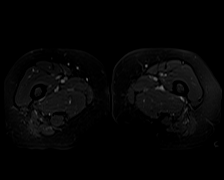
[im 12/35]
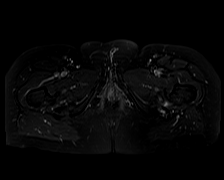
[im 23/35]
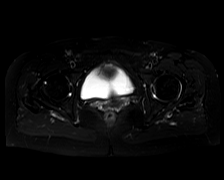
[im 35/35]
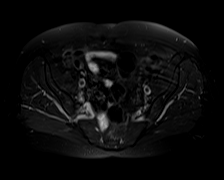

[Series 7: PD fat-sat · sagittal · left · 4.0mm · 0.70mm/px · 3 of 25 slices shown (1 of 2)]
[im 1/25]
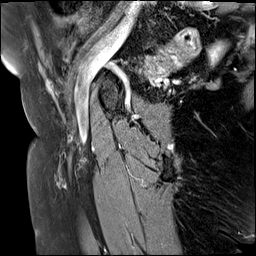
[im 13/25]
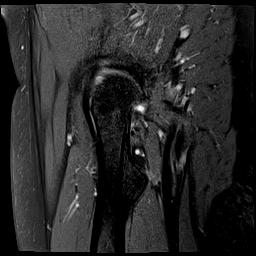
[im 25/25]
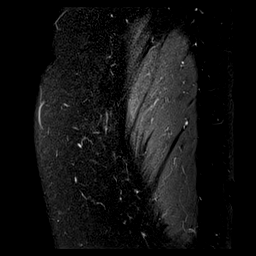

[Series 8: PD fat-sat · coronal · left · 4.0mm · 0.70mm/px · 2 of 20 slices shown (2 of 2)]
[im 1/20]
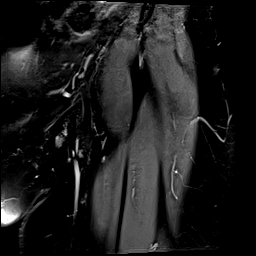
[im 20/20]
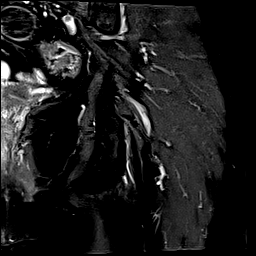

[17 of 40 positions shown; findings below may reference images not displayed]

FINDINGS: Bones: There is no evidence of acute fracture, dislocation or
avascular necrosis. Small focus of marrow edema in the lateral right
femoral head, presumably degenerative. Adjacent small synovial
herniation pit. No focal bone lesion. The visualized sacroiliac
joints and symphysis pubis appear normal.

Articular cartilage and labrum

Articular cartilage: No focal chondral defect or subchondral signal
abnormality identified.

Labrum: Tears of the left anterior and superior labrum (series 7,
image 5; series 5, image 17). No paralabral abnormality.

Joint or bursal effusion

Joint effusion: No significant hip joint effusion.

Bursae: No focal periarticular fluid collection.

Muscles and tendons

Muscles and tendons: Bilateral hamstring tendinosis with small
partial tear of the left hamstring tendon origin. The visualized
gluteus and iliopsoas tendons appear normal. No muscle edema or
atrophy.

Other findings

Miscellaneous: Small uterine fibroids.
IMPRESSION: 1. Tears of the left anterior and superior labrum.
2. Bilateral hamstring tendinosis with small partial tear of the
left hamstring tendon origin.

## 2021-09-16 DIAGNOSIS — M25552 Pain in left hip: Secondary | ICD-10-CM | POA: Diagnosis not present

## 2021-09-17 DIAGNOSIS — M25552 Pain in left hip: Secondary | ICD-10-CM | POA: Diagnosis not present

## 2021-10-21 ENCOUNTER — Other Ambulatory Visit (HOSPITAL_COMMUNITY): Payer: Self-pay | Admitting: Orthopedic Surgery

## 2021-10-21 ENCOUNTER — Other Ambulatory Visit: Payer: Self-pay | Admitting: Orthopedic Surgery

## 2021-10-21 DIAGNOSIS — M545 Low back pain, unspecified: Secondary | ICD-10-CM

## 2021-10-30 ENCOUNTER — Ambulatory Visit (HOSPITAL_COMMUNITY)
Admission: RE | Admit: 2021-10-30 | Discharge: 2021-10-30 | Disposition: A | Discharge status: Home / Self Care | Payer: PPO | Source: Ambulatory Visit | Attending: Orthopedic Surgery | Admitting: Orthopedic Surgery

## 2021-10-30 ENCOUNTER — Other Ambulatory Visit: Payer: Self-pay

## 2021-10-30 DIAGNOSIS — M47816 Spondylosis without myelopathy or radiculopathy, lumbar region: Secondary | ICD-10-CM | POA: Diagnosis not present

## 2021-10-30 DIAGNOSIS — M545 Low back pain, unspecified: Secondary | ICD-10-CM | POA: Insufficient documentation

## 2021-10-30 IMAGING — MR MR LUMBAR SPINE W/O CM
5 series · 31 of 48 positions shown · non-contrast
Comparison: None.

CLINICAL DATA: Low back pain radiating into the left hip for over 1
year

EXAM:
MRI LUMBAR SPINE WITHOUT CONTRAST
TECHNIQUE: Multiplanar, multisequence MR imaging of the lumbar spine was
performed. No intravenous contrast was administered.

[Series 5: T2 · sagittal · 4.0mm · 0.68mm/px · 6 of 16 slices shown (1 of 2)]
[im 1/16]
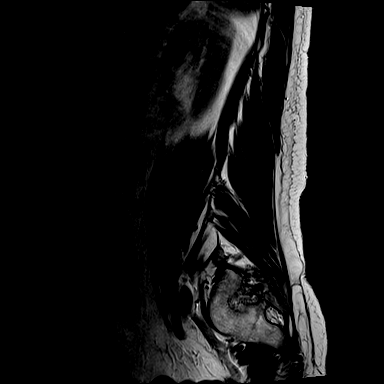
[im 4/16]
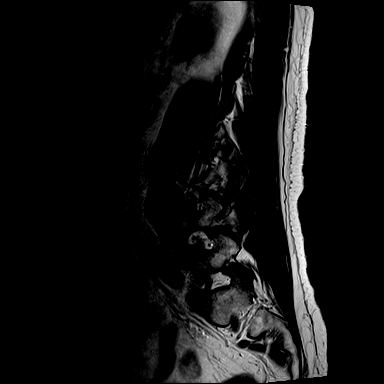
[im 7/16]
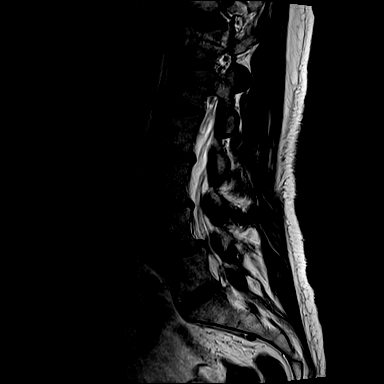
[im 10/16]
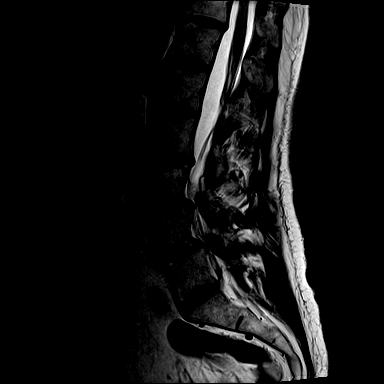
[im 13/16]
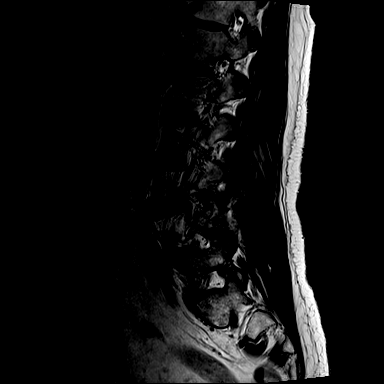
[im 16/16]
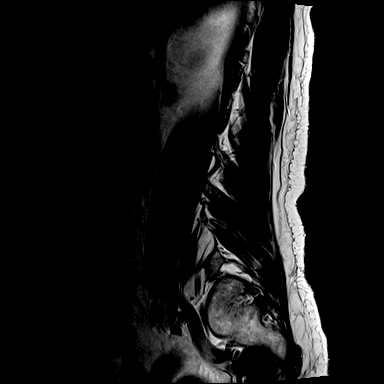

[Series 6: T1 · sagittal · 4.0mm · 0.81mm/px · 7 of 15 slices shown (1 of 2)]
[im 1/15]
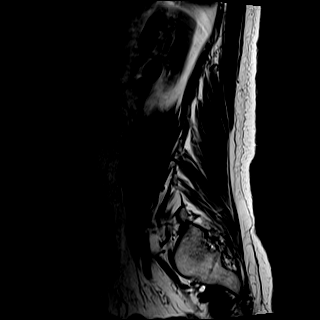
[im 3/15]
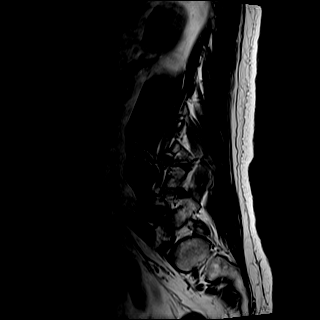
[im 5/15]
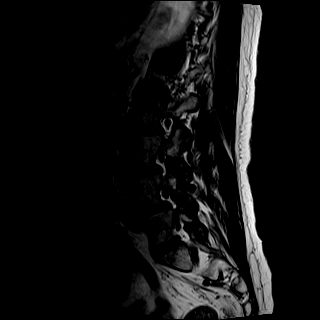
[im 8/15]
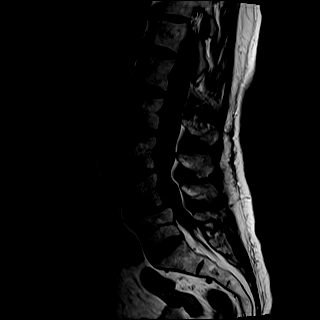
[im 10/15]
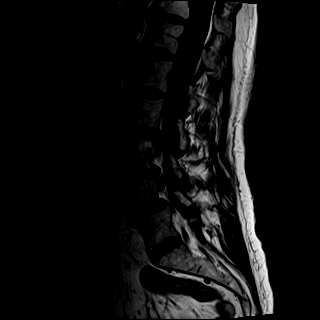
[im 12/15]
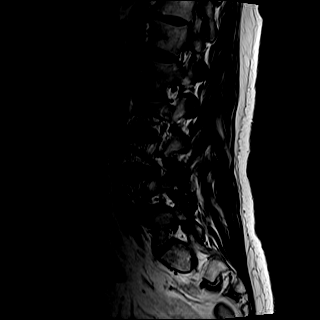
[im 15/15]
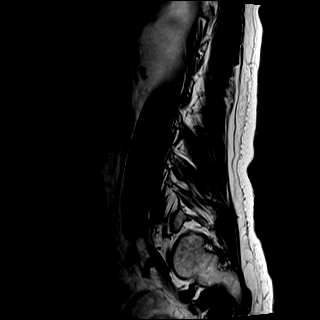

[Series 7: STIR · sagittal · 4.0mm · 0.51mm/px · 2 of 15 slices shown]
[im 1/15]
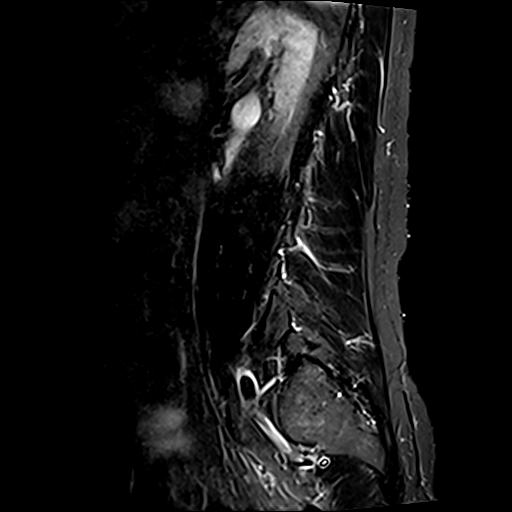
[im 3/15]
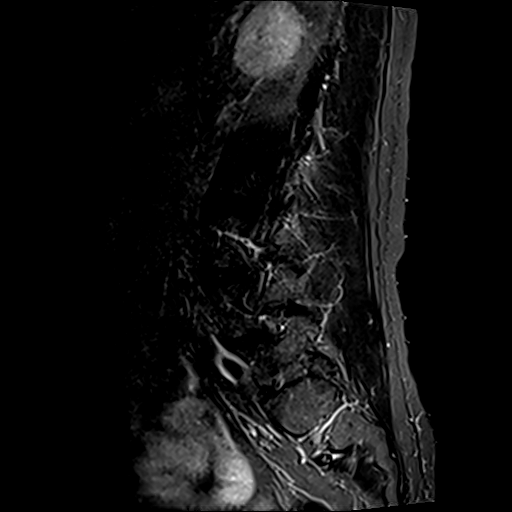

[Series 8: T2 · axial · 4.0mm · 0.70mm/px · z∈[-81,+87]mm · 8 of 30 slices shown (2 of 2)]
[im 1/30]
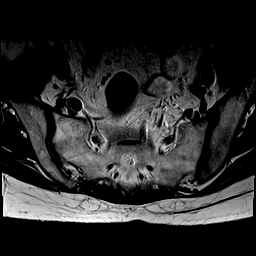
[im 5/30]
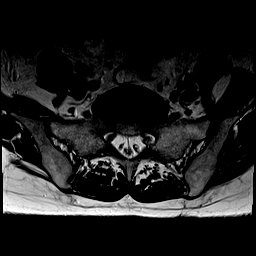
[im 9/30]
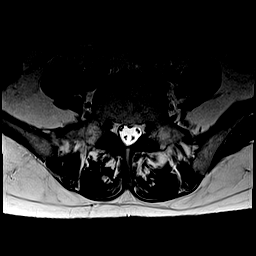
[im 14/30]
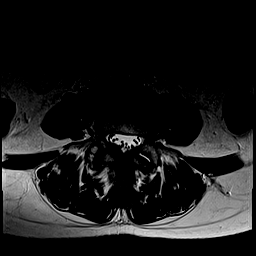
[im 16/30]
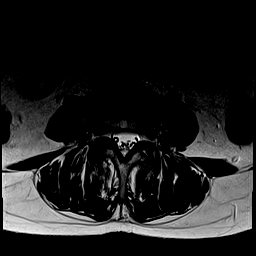
[im 21/30]
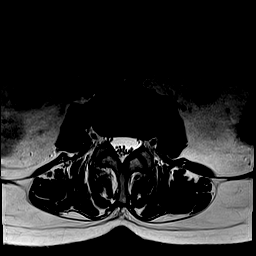
[im 25/30]
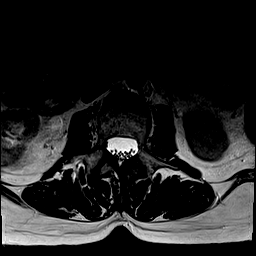
[im 30/30]
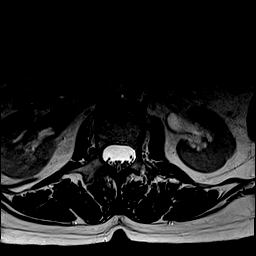

[Series 9: T1 · axial · 4.0mm · 0.35mm/px · z∈[-81,+87]mm · 8 of 30 slices shown (2 of 2)]
[im 1/30]
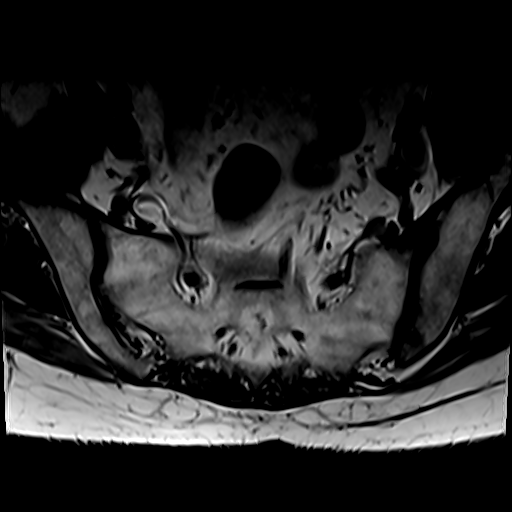
[im 5/30]
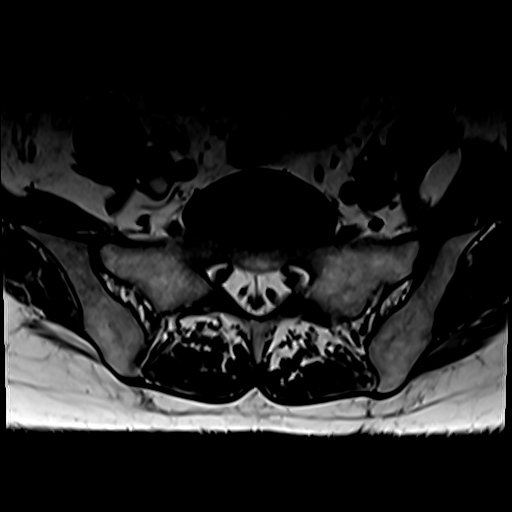
[im 9/30]
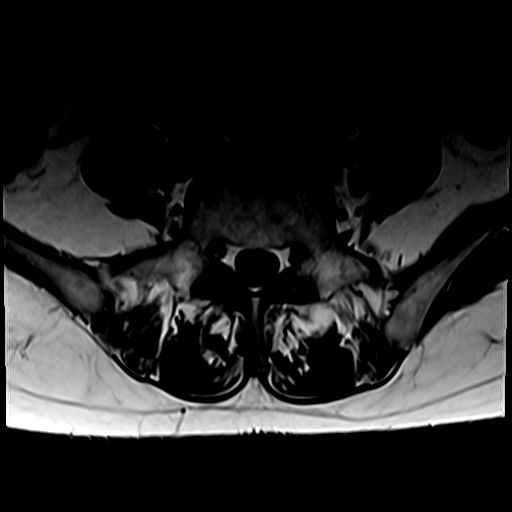
[im 14/30]
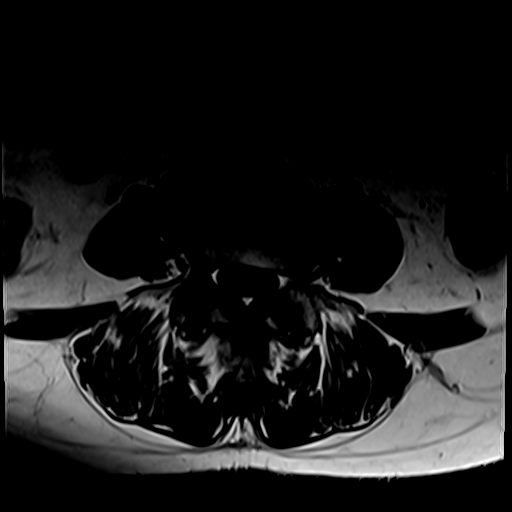
[im 16/30]
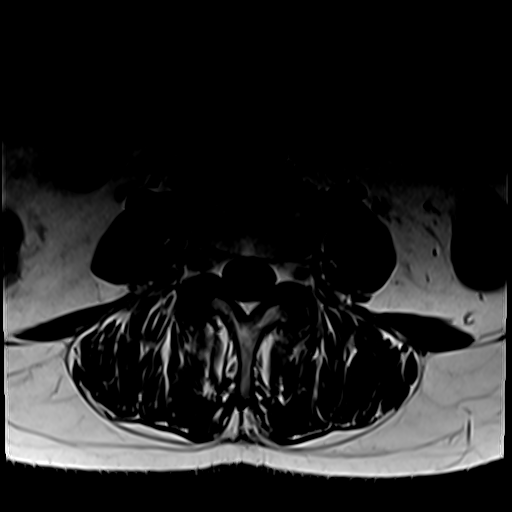
[im 21/30]
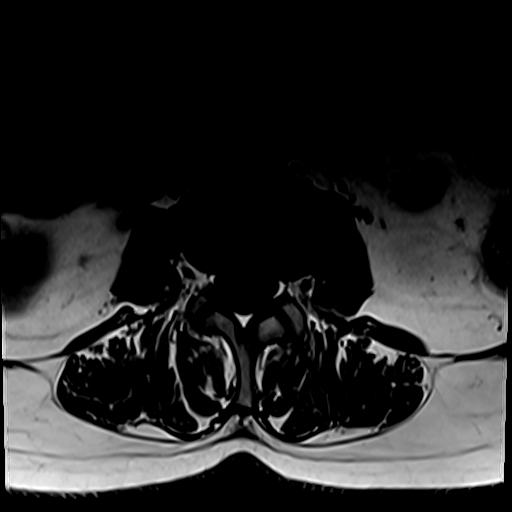
[im 25/30]
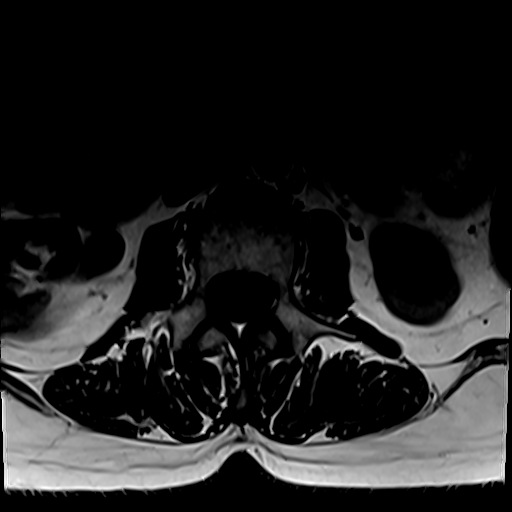
[im 30/30]
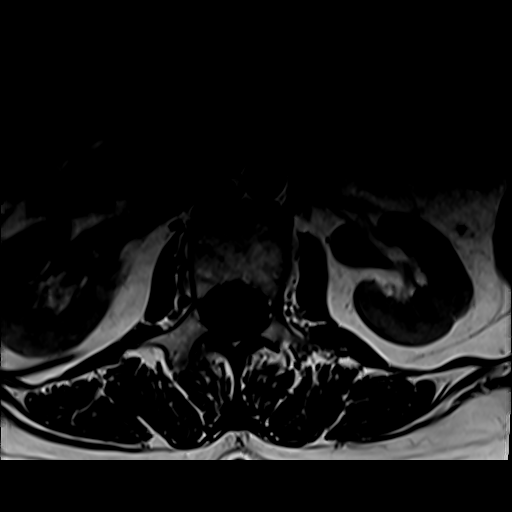

[31 of 48 positions shown; findings below may reference images not displayed]

FINDINGS: Segmentation:  5 lumbar type vertebrae

Alignment:  Grade 1 anterolisthesis at L3-4 and L4-5

Vertebrae:  No fracture, evidence of discitis, or bone lesion.

Conus medullaris and cauda equina: Conus extends to the T12-L1
level. Conus and cauda equina appear normal.

Paraspinal and other soft tissues: Negative for perispinal mass or
inflammation

Disc levels:

T12- L1: Unremarkable.

L1-L2: Unremarkable.

L2-L3: Disc narrowing and bulging.  Negative facets

L3-L4: Disc narrowing and bulging. Facet spurring and ligamentum
flavum thickening with mild-to-moderate spinal and right foraminal
stenosis.

L4-L5: Facet degeneration with anterolisthesis, disc narrowing, and
bulging. Mild bilateral subarticular recess stenosis.

L5-S1:Unremarkable.
IMPRESSION: Lumbar spine degeneration especially affecting the L3-4 and L4-5
levels where there is anterolisthesis. Mild to moderate spinal
stenosis at L3-4.

## 2021-10-31 DIAGNOSIS — M25552 Pain in left hip: Secondary | ICD-10-CM | POA: Diagnosis not present

## 2022-04-16 ENCOUNTER — Other Ambulatory Visit (HOSPITAL_COMMUNITY): Payer: Self-pay | Admitting: Family Medicine

## 2022-04-16 DIAGNOSIS — Z1231 Encounter for screening mammogram for malignant neoplasm of breast: Secondary | ICD-10-CM

## 2022-04-20 ENCOUNTER — Ambulatory Visit (HOSPITAL_COMMUNITY): Payer: PPO

## 2022-04-20 ENCOUNTER — Ambulatory Visit (HOSPITAL_COMMUNITY)
Admission: RE | Admit: 2022-04-20 | Discharge: 2022-04-20 | Disposition: A | Discharge status: Home / Self Care | Payer: PPO | Source: Ambulatory Visit | Attending: Family Medicine | Admitting: Family Medicine

## 2022-04-20 DIAGNOSIS — Z1231 Encounter for screening mammogram for malignant neoplasm of breast: Secondary | ICD-10-CM | POA: Diagnosis present

## 2022-04-20 IMAGING — MG MM DIGITAL SCREENING BILAT W/ TOMO AND CAD
8 series · 9 of 24 positions shown · non-contrast
Comparison: Previous exam(s).

CLINICAL DATA: Screening.

EXAM:
DIGITAL SCREENING BILATERAL MAMMOGRAM WITH TOMOSYNTHESIS AND CAD
TECHNIQUE: Bilateral screening digital craniocaudal and mediolateral oblique
mammograms were obtained. Bilateral screening digital breast
tomosynthesis was performed. The images were evaluated with
computer-aided detection.

[R CC synth-2D]
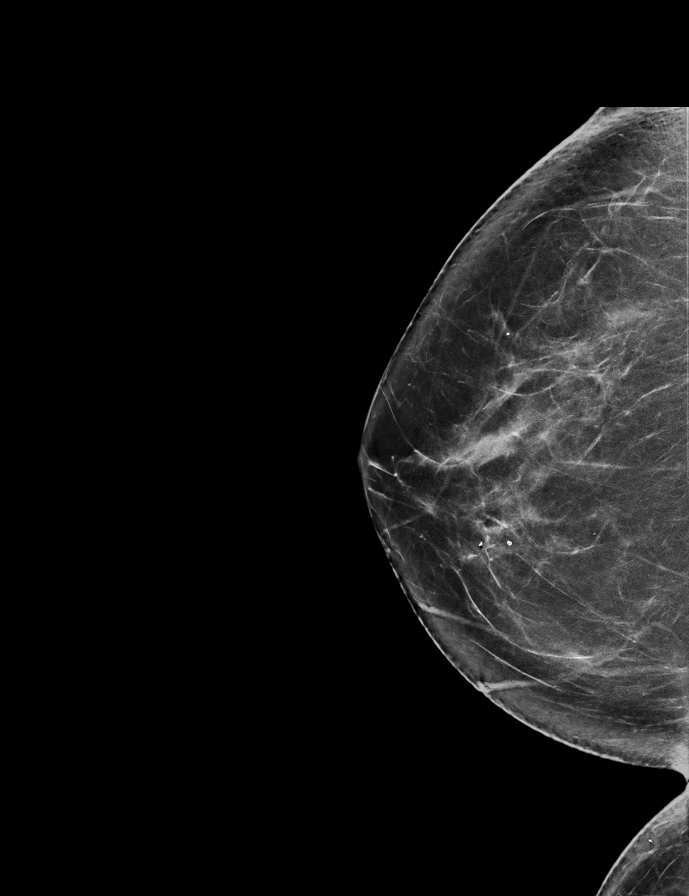

[L MLO synth-2D]
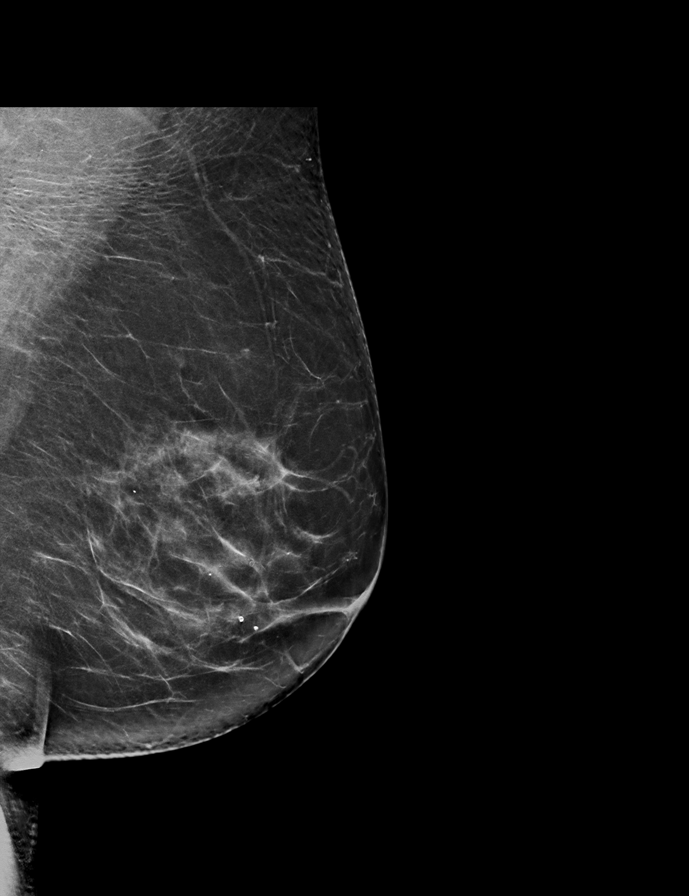

[L CC synth-2D]
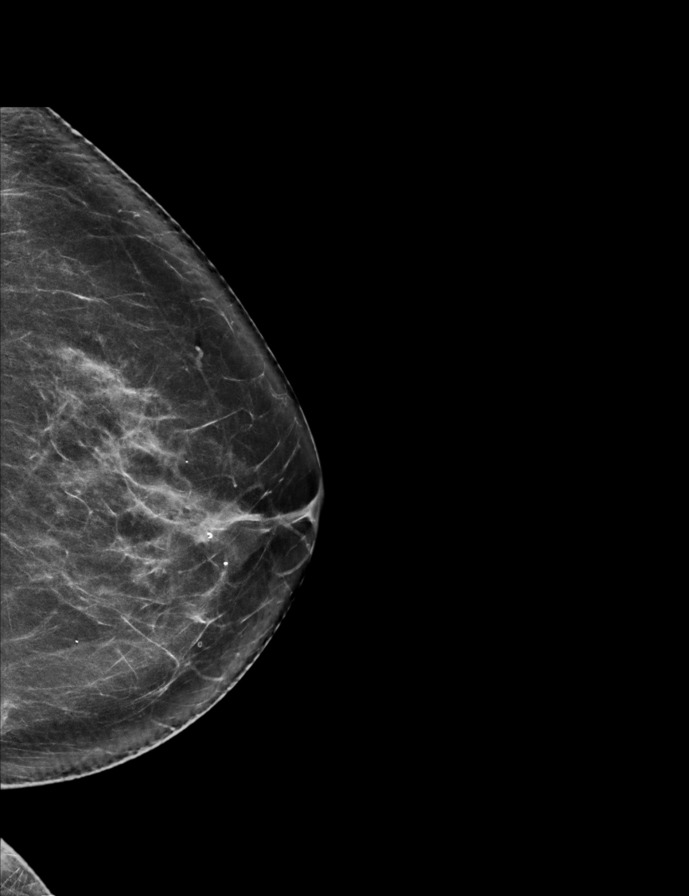

[R MLO synth-2D]
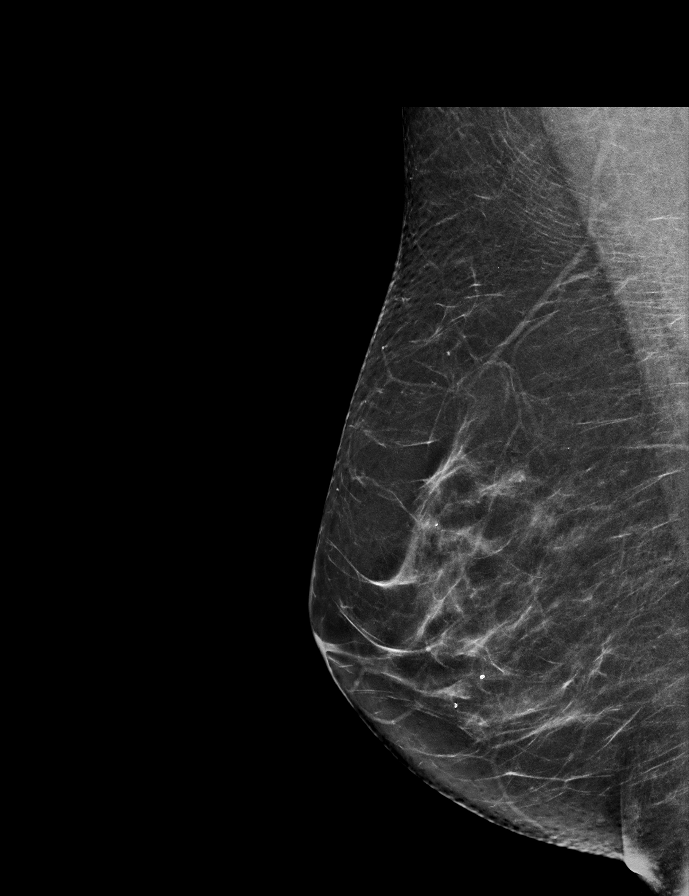

[L MLO tomo · 2 of 76 frames shown]
[frame 25/76]
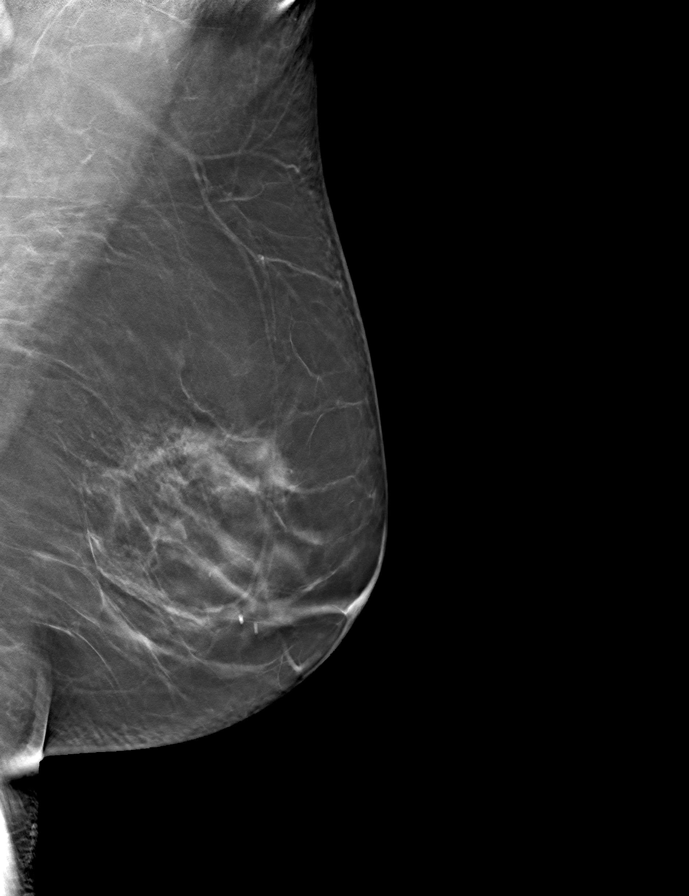
[frame 39/76]
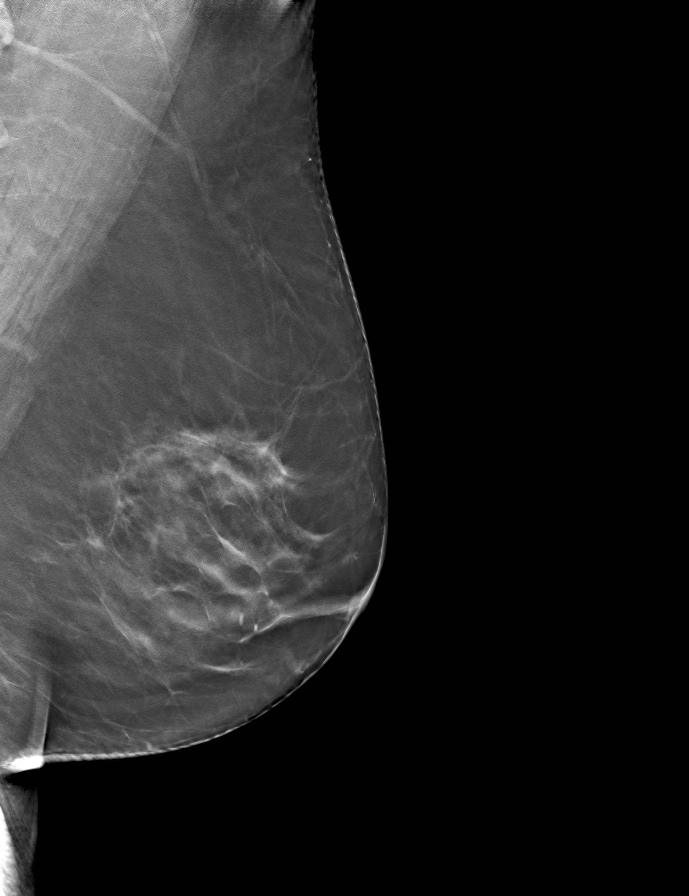

[R MLO tomo · tomo slice 36/71.0]
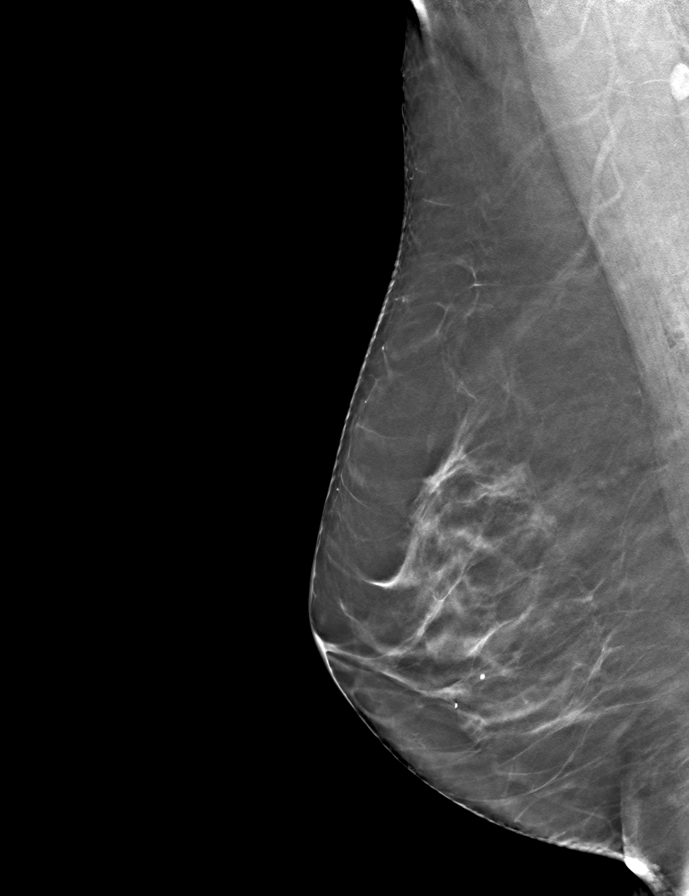

[L CC tomo · tomo slice 36/71.0]
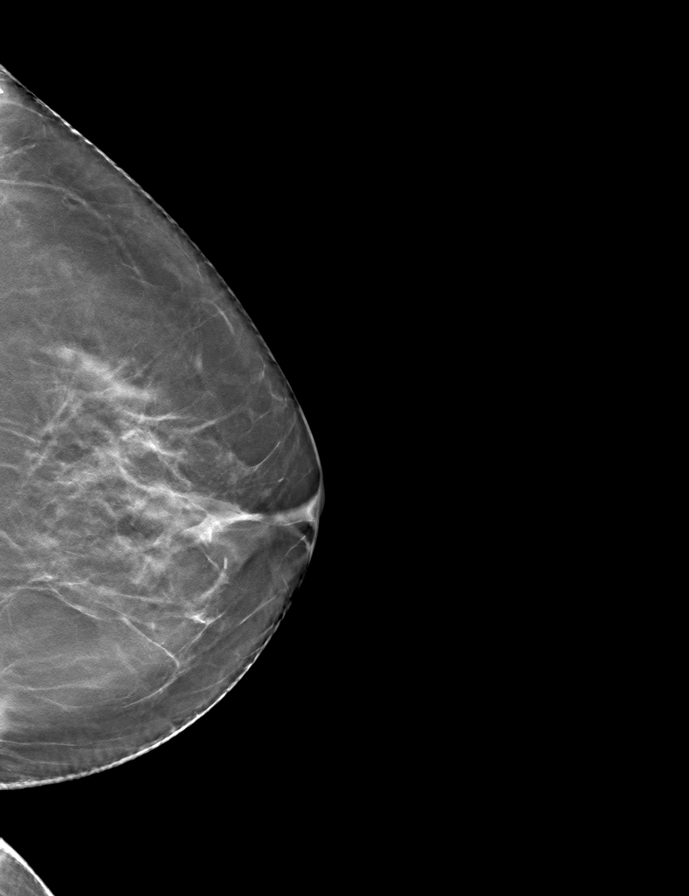

[R CC tomo · tomo slice 37/73.0]
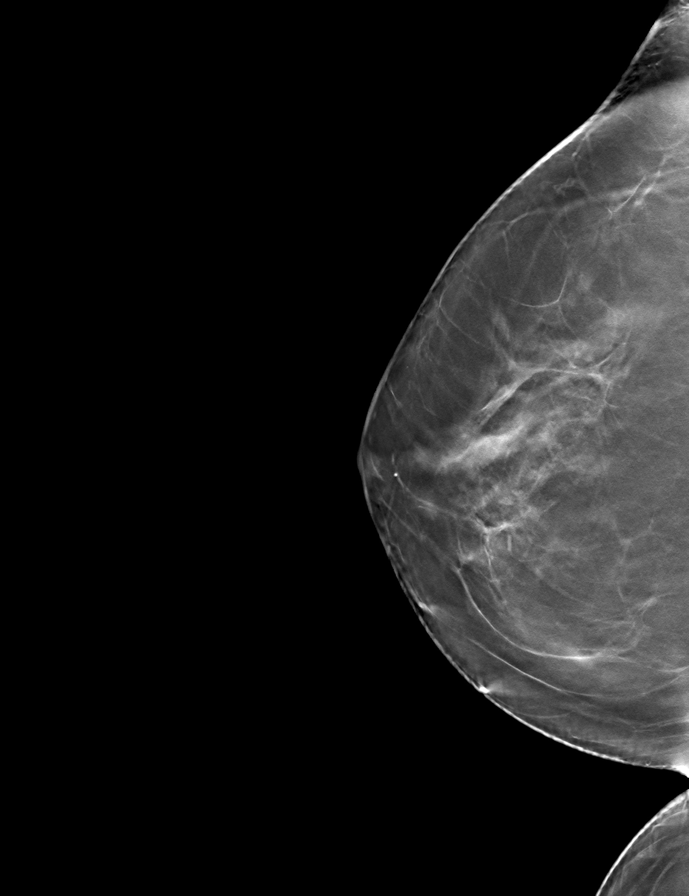

[9 of 24 positions shown; findings below may reference images not displayed]

ACR Breast Density Category b: There are scattered areas of
fibroglandular density.
FINDINGS: There are no findings suspicious for malignancy.
IMPRESSION: No mammographic evidence of malignancy. A result letter of this
screening mammogram will be mailed directly to the patient.

RECOMMENDATION:
Screening mammogram in one year. (Code:[BY])

BI-RADS CATEGORY  1: Negative.

## 2022-05-08 LAB — COLOGUARD: COLOGUARD: NEGATIVE

## 2022-05-08 LAB — EXTERNAL GENERIC LAB PROCEDURE: COLOGUARD: NEGATIVE

## 2023-03-08 DIAGNOSIS — L570 Actinic keratosis: Secondary | ICD-10-CM | POA: Diagnosis not present

## 2023-03-08 DIAGNOSIS — D2230 Melanocytic nevi of unspecified part of face: Secondary | ICD-10-CM | POA: Diagnosis not present

## 2023-03-08 DIAGNOSIS — D239 Other benign neoplasm of skin, unspecified: Secondary | ICD-10-CM | POA: Diagnosis not present

## 2023-03-08 DIAGNOSIS — D485 Neoplasm of uncertain behavior of skin: Secondary | ICD-10-CM | POA: Diagnosis not present

## 2023-03-08 DIAGNOSIS — Z1283 Encounter for screening for malignant neoplasm of skin: Secondary | ICD-10-CM | POA: Diagnosis not present

## 2023-03-11 ENCOUNTER — Other Ambulatory Visit (HOSPITAL_COMMUNITY): Payer: Self-pay | Admitting: Family Medicine

## 2023-03-11 DIAGNOSIS — Z1231 Encounter for screening mammogram for malignant neoplasm of breast: Secondary | ICD-10-CM

## 2023-04-22 DIAGNOSIS — D2239 Melanocytic nevi of other parts of face: Secondary | ICD-10-CM | POA: Diagnosis not present

## 2023-04-22 DIAGNOSIS — D485 Neoplasm of uncertain behavior of skin: Secondary | ICD-10-CM | POA: Diagnosis not present

## 2023-04-27 DIAGNOSIS — K219 Gastro-esophageal reflux disease without esophagitis: Secondary | ICD-10-CM | POA: Diagnosis not present

## 2023-04-27 DIAGNOSIS — I10 Essential (primary) hypertension: Secondary | ICD-10-CM | POA: Diagnosis not present

## 2023-04-27 DIAGNOSIS — Z131 Encounter for screening for diabetes mellitus: Secondary | ICD-10-CM | POA: Diagnosis not present

## 2023-04-27 DIAGNOSIS — E7849 Other hyperlipidemia: Secondary | ICD-10-CM | POA: Diagnosis not present

## 2023-04-27 DIAGNOSIS — R739 Hyperglycemia, unspecified: Secondary | ICD-10-CM | POA: Diagnosis not present

## 2023-04-27 DIAGNOSIS — Z0001 Encounter for general adult medical examination with abnormal findings: Secondary | ICD-10-CM | POA: Diagnosis not present

## 2023-04-27 DIAGNOSIS — Z1329 Encounter for screening for other suspected endocrine disorder: Secondary | ICD-10-CM | POA: Diagnosis not present

## 2023-04-27 DIAGNOSIS — E7801 Familial hypercholesterolemia: Secondary | ICD-10-CM | POA: Diagnosis not present

## 2023-04-28 ENCOUNTER — Ambulatory Visit (HOSPITAL_COMMUNITY)
Admission: RE | Admit: 2023-04-28 | Discharge: 2023-04-28 | Disposition: A | Discharge status: Home / Self Care | Payer: PPO | Source: Ambulatory Visit | Attending: Family Medicine | Admitting: Family Medicine

## 2023-04-28 DIAGNOSIS — Z1231 Encounter for screening mammogram for malignant neoplasm of breast: Secondary | ICD-10-CM | POA: Diagnosis not present

## 2023-05-06 DIAGNOSIS — Z6824 Body mass index (BMI) 24.0-24.9, adult: Secondary | ICD-10-CM | POA: Diagnosis not present

## 2023-05-06 DIAGNOSIS — E7849 Other hyperlipidemia: Secondary | ICD-10-CM | POA: Diagnosis not present

## 2023-05-06 DIAGNOSIS — K219 Gastro-esophageal reflux disease without esophagitis: Secondary | ICD-10-CM | POA: Diagnosis not present

## 2023-05-06 DIAGNOSIS — F411 Generalized anxiety disorder: Secondary | ICD-10-CM | POA: Diagnosis not present

## 2023-05-06 DIAGNOSIS — R030 Elevated blood-pressure reading, without diagnosis of hypertension: Secondary | ICD-10-CM | POA: Diagnosis not present

## 2023-05-06 DIAGNOSIS — Z0000 Encounter for general adult medical examination without abnormal findings: Secondary | ICD-10-CM | POA: Diagnosis not present

## 2023-08-03 DIAGNOSIS — R030 Elevated blood-pressure reading, without diagnosis of hypertension: Secondary | ICD-10-CM | POA: Diagnosis not present

## 2023-08-03 DIAGNOSIS — R300 Dysuria: Secondary | ICD-10-CM | POA: Diagnosis not present

## 2023-08-03 DIAGNOSIS — Z0001 Encounter for general adult medical examination with abnormal findings: Secondary | ICD-10-CM | POA: Diagnosis not present

## 2023-08-03 DIAGNOSIS — F411 Generalized anxiety disorder: Secondary | ICD-10-CM | POA: Diagnosis not present

## 2023-08-03 DIAGNOSIS — Z6824 Body mass index (BMI) 24.0-24.9, adult: Secondary | ICD-10-CM | POA: Diagnosis not present

## 2023-08-03 DIAGNOSIS — Z23 Encounter for immunization: Secondary | ICD-10-CM | POA: Diagnosis not present

## 2023-08-23 DIAGNOSIS — Z961 Presence of intraocular lens: Secondary | ICD-10-CM | POA: Diagnosis not present

## 2024-01-26 DIAGNOSIS — Z6823 Body mass index (BMI) 23.0-23.9, adult: Secondary | ICD-10-CM | POA: Diagnosis not present

## 2024-01-26 DIAGNOSIS — K5909 Other constipation: Secondary | ICD-10-CM | POA: Diagnosis not present

## 2024-02-10 ENCOUNTER — Encounter: Payer: Self-pay | Admitting: *Deleted

## 2024-02-10 ENCOUNTER — Ambulatory Visit: Admitting: Internal Medicine

## 2024-02-10 ENCOUNTER — Encounter: Payer: Self-pay | Admitting: Internal Medicine

## 2024-02-10 ENCOUNTER — Other Ambulatory Visit: Payer: Self-pay | Admitting: *Deleted

## 2024-02-10 VITALS — BP 127/70 | HR 81 | Temp 98.4°F | Ht 64.0 in | Wt 133.4 lb

## 2024-02-10 DIAGNOSIS — Z1211 Encounter for screening for malignant neoplasm of colon: Secondary | ICD-10-CM

## 2024-02-10 DIAGNOSIS — K5909 Other constipation: Secondary | ICD-10-CM

## 2024-02-10 DIAGNOSIS — K219 Gastro-esophageal reflux disease without esophagitis: Secondary | ICD-10-CM | POA: Diagnosis not present

## 2024-02-10 DIAGNOSIS — K5904 Chronic idiopathic constipation: Secondary | ICD-10-CM

## 2024-02-10 MED ORDER — CLENPIQ 10-3.5-12 MG-GM -GM/175ML PO SOLN: 1.0000 | ORAL | 0 refills | Status: DC

## 2024-02-10 NOTE — H&P (View-Only) (Signed)
 Primary Care Physician:  Lianne Moris, PA-C Primary Gastroenterologist:  Dr. Marletta Lor  Chief Complaint  Patient presents with   Constipation    HPI:   Lindsay Wilson is a 68 y.o. female who presents to clinic today by referral from her PCP Lianne Moris for evaluation.  Chronic constipation: Chronic issue for many years.  Worsening over the past 6 months.  Patient reports she will go up to 2 weeks without having a bowel movement.  Not currently taking anything for this.  Denies any associated abdominal pain or bloating related to her constipation.  No melena hematochezia.  Chronic GERD: Well-controlled on omeprazole daily.  Colon cancer screening: Last colonoscopy 2008 1 benign hyperplastic polyp removed.  No family history of colorectal malignancy.  Has used Cologuard previously which has been negative.  Denies any melena hematochezia.  No unintentional weight loss.  Past Medical History:  Diagnosis Date   Chronic constipation    GERD (gastroesophageal reflux disease)     Past Surgical History:  Procedure Laterality Date   CATARACT EXTRACTION      Current Outpatient Medications  Medication Sig Dispense Refill   omeprazole (PRILOSEC) 20 MG capsule Take 20 mg by mouth daily.     rosuvastatin (CRESTOR) 10 MG tablet Take 10 mg by mouth 3 (three) times a week.     venlafaxine XR (EFFEXOR-XR) 75 MG 24 hr capsule Take 75 mg by mouth daily.     No current facility-administered medications for this visit.    Allergies as of 02/10/2024 - Review Complete 02/10/2024  Allergen Reaction Noted   Penicillins  05/29/2021    Family History  Problem Relation Age of Onset   Macular degeneration Mother     Social History   Socioeconomic History   Marital status: Married    Spouse name: Not on file   Number of children: Not on file   Years of education: Not on file   Highest education level: Not on file  Occupational History   Not on file  Tobacco Use   Smoking status: Never     Passive exposure: Never   Smokeless tobacco: Never  Substance and Sexual Activity   Alcohol use: Not on file   Drug use: Not on file   Sexual activity: Not on file  Other Topics Concern   Not on file  Social History Narrative   Not on file   Social Drivers of Health   Financial Resource Strain: Not on file  Food Insecurity: Not on file  Transportation Needs: Not on file  Physical Activity: Not on file  Stress: Not on file  Social Connections: Not on file  Intimate Partner Violence: Not on file    Subjective: Review of Systems  Constitutional:  Negative for chills and fever.  HENT:  Negative for congestion and hearing loss.   Eyes:  Negative for blurred vision and double vision.  Respiratory:  Negative for cough and shortness of breath.   Cardiovascular:  Negative for chest pain and palpitations.  Gastrointestinal:  Positive for constipation. Negative for abdominal pain, blood in stool, diarrhea, heartburn, melena and vomiting.  Genitourinary:  Negative for dysuria and urgency.  Musculoskeletal:  Negative for joint pain and myalgias.  Skin:  Negative for itching and rash.  Neurological:  Negative for dizziness and headaches.  Psychiatric/Behavioral:  Negative for depression. The patient is not nervous/anxious.        Objective: BP 127/70 (BP Location: Right Arm, Patient Position: Sitting, Cuff Size: Normal)  Pulse 81   Temp 98.4 F (36.9 C) (Oral)   Ht 5\' 4"  (1.626 m)   Wt 133 lb 6.4 oz (60.5 kg)   SpO2 96%   BMI 22.90 kg/m  Physical Exam Constitutional:      Appearance: Normal appearance.  HENT:     Head: Normocephalic and atraumatic.  Eyes:     Extraocular Movements: Extraocular movements intact.     Conjunctiva/sclera: Conjunctivae normal.  Cardiovascular:     Rate and Rhythm: Normal rate and regular rhythm.  Pulmonary:     Effort: Pulmonary effort is normal.     Breath sounds: Normal breath sounds.  Abdominal:     General: Bowel sounds are normal.      Palpations: Abdomen is soft.  Musculoskeletal:        General: No swelling. Normal range of motion.     Cervical back: Normal range of motion and neck supple.  Skin:    General: Skin is warm and dry.     Coloration: Skin is not jaundiced.  Neurological:     General: No focal deficit present.     Mental Status: She is alert and oriented to person, place, and time.  Psychiatric:        Mood and Affect: Mood normal.        Behavior: Behavior normal.      Assessment/Plan:  1.  Colon cancer screening- Will schedule for screening colonoscopy.The risks including infection, bleed, or perforation as well as benefits, limitations, alternatives and imponderables have been reviewed with the patient. Questions have been answered. All parties agreeable.  2.  Chronic constipation-samples of Linzess 290 mcg daily provided today.  Counseled on initial washout.  She understands.  Counseled we have room to decrease dose if this is too strong.  Call with update in 1 week and I can send in formal prescription if improved.  3.  Chronic GERD-well-controlled omeprazole daily.  Thank you Lianne Moris for the kind referral  02/10/2024 11:22 AM   Disclaimer: This note was dictated with voice recognition software. Similar sounding words can inadvertently be transcribed and may not be corrected upon review.

## 2024-02-10 NOTE — Progress Notes (Signed)
 Primary Care Physician:  Lianne Moris, PA-C Primary Gastroenterologist:  Dr. Marletta Lor  Chief Complaint  Patient presents with   Constipation    HPI:   Lindsay Wilson is a 68 y.o. female who presents to clinic today by referral from her PCP Lianne Moris for evaluation.  Chronic constipation: Chronic issue for many years.  Worsening over the past 6 months.  Patient reports she will go up to 2 weeks without having a bowel movement.  Not currently taking anything for this.  Denies any associated abdominal pain or bloating related to her constipation.  No melena hematochezia.  Chronic GERD: Well-controlled on omeprazole daily.  Colon cancer screening: Last colonoscopy 2008 1 benign hyperplastic polyp removed.  No family history of colorectal malignancy.  Has used Cologuard previously which has been negative.  Denies any melena hematochezia.  No unintentional weight loss.  Past Medical History:  Diagnosis Date   Chronic constipation    GERD (gastroesophageal reflux disease)     Past Surgical History:  Procedure Laterality Date   CATARACT EXTRACTION      Current Outpatient Medications  Medication Sig Dispense Refill   omeprazole (PRILOSEC) 20 MG capsule Take 20 mg by mouth daily.     rosuvastatin (CRESTOR) 10 MG tablet Take 10 mg by mouth 3 (three) times a week.     venlafaxine XR (EFFEXOR-XR) 75 MG 24 hr capsule Take 75 mg by mouth daily.     No current facility-administered medications for this visit.    Allergies as of 02/10/2024 - Review Complete 02/10/2024  Allergen Reaction Noted   Penicillins  05/29/2021    Family History  Problem Relation Age of Onset   Macular degeneration Mother     Social History   Socioeconomic History   Marital status: Married    Spouse name: Not on file   Number of children: Not on file   Years of education: Not on file   Highest education level: Not on file  Occupational History   Not on file  Tobacco Use   Smoking status: Never     Passive exposure: Never   Smokeless tobacco: Never  Substance and Sexual Activity   Alcohol use: Not on file   Drug use: Not on file   Sexual activity: Not on file  Other Topics Concern   Not on file  Social History Narrative   Not on file   Social Drivers of Health   Financial Resource Strain: Not on file  Food Insecurity: Not on file  Transportation Needs: Not on file  Physical Activity: Not on file  Stress: Not on file  Social Connections: Not on file  Intimate Partner Violence: Not on file    Subjective: Review of Systems  Constitutional:  Negative for chills and fever.  HENT:  Negative for congestion and hearing loss.   Eyes:  Negative for blurred vision and double vision.  Respiratory:  Negative for cough and shortness of breath.   Cardiovascular:  Negative for chest pain and palpitations.  Gastrointestinal:  Positive for constipation. Negative for abdominal pain, blood in stool, diarrhea, heartburn, melena and vomiting.  Genitourinary:  Negative for dysuria and urgency.  Musculoskeletal:  Negative for joint pain and myalgias.  Skin:  Negative for itching and rash.  Neurological:  Negative for dizziness and headaches.  Psychiatric/Behavioral:  Negative for depression. The patient is not nervous/anxious.        Objective: BP 127/70 (BP Location: Right Arm, Patient Position: Sitting, Cuff Size: Normal)  Pulse 81   Temp 98.4 F (36.9 C) (Oral)   Ht 5\' 4"  (1.626 m)   Wt 133 lb 6.4 oz (60.5 kg)   SpO2 96%   BMI 22.90 kg/m  Physical Exam Constitutional:      Appearance: Normal appearance.  HENT:     Head: Normocephalic and atraumatic.  Eyes:     Extraocular Movements: Extraocular movements intact.     Conjunctiva/sclera: Conjunctivae normal.  Cardiovascular:     Rate and Rhythm: Normal rate and regular rhythm.  Pulmonary:     Effort: Pulmonary effort is normal.     Breath sounds: Normal breath sounds.  Abdominal:     General: Bowel sounds are normal.      Palpations: Abdomen is soft.  Musculoskeletal:        General: No swelling. Normal range of motion.     Cervical back: Normal range of motion and neck supple.  Skin:    General: Skin is warm and dry.     Coloration: Skin is not jaundiced.  Neurological:     General: No focal deficit present.     Mental Status: She is alert and oriented to person, place, and time.  Psychiatric:        Mood and Affect: Mood normal.        Behavior: Behavior normal.      Assessment/Plan:  1.  Colon cancer screening- Will schedule for screening colonoscopy.The risks including infection, bleed, or perforation as well as benefits, limitations, alternatives and imponderables have been reviewed with the patient. Questions have been answered. All parties agreeable.  2.  Chronic constipation-samples of Linzess 290 mcg daily provided today.  Counseled on initial washout.  She understands.  Counseled we have room to decrease dose if this is too strong.  Call with update in 1 week and I can send in formal prescription if improved.  3.  Chronic GERD-well-controlled omeprazole daily.  Thank you Lianne Moris for the kind referral  02/10/2024 11:22 AM   Disclaimer: This note was dictated with voice recognition software. Similar sounding words can inadvertently be transcribed and may not be corrected upon review.

## 2024-02-10 NOTE — Progress Notes (Deleted)
    Primary Care Physician:  Lianne Moris, PA-C Primary Gastroenterologist:  Dr. Marletta Lor  Chief Complaint  Patient presents with   Constipation    HPI:   Lindsay Wilson is a 68 y.o. female who presents   Past Medical History:  Diagnosis Date   Chronic constipation    GERD (gastroesophageal reflux disease)     Past Surgical History:  Procedure Laterality Date   CATARACT EXTRACTION      Current Outpatient Medications  Medication Sig Dispense Refill   omeprazole (PRILOSEC) 20 MG capsule Take 20 mg by mouth daily.     rosuvastatin (CRESTOR) 10 MG tablet Take 10 mg by mouth 3 (three) times a week.     venlafaxine XR (EFFEXOR-XR) 75 MG 24 hr capsule Take 75 mg by mouth daily.     No current facility-administered medications for this visit.    Allergies as of 02/10/2024 - Review Complete 02/10/2024  Allergen Reaction Noted   Penicillins  05/29/2021    Family History  Problem Relation Age of Onset   Macular degeneration Mother     Social History   Socioeconomic History   Marital status: Married    Spouse name: Not on file   Number of children: Not on file   Years of education: Not on file   Highest education level: Not on file  Occupational History   Not on file  Tobacco Use   Smoking status: Never    Passive exposure: Never   Smokeless tobacco: Never  Substance and Sexual Activity   Alcohol use: Not on file   Drug use: Not on file   Sexual activity: Not on file  Other Topics Concern   Not on file  Social History Narrative   Not on file   Social Drivers of Health   Financial Resource Strain: Not on file  Food Insecurity: Not on file  Transportation Needs: Not on file  Physical Activity: Not on file  Stress: Not on file  Social Connections: Not on file  Intimate Partner Violence: Not on file    Subjective: ROS     Objective: BP 127/70 (BP Location: Right Arm, Patient Position: Sitting, Cuff Size: Normal)   Pulse 81   Temp 98.4 F (36.9 C)  (Oral)   Ht 5\' 4"  (1.626 m)   Wt 133 lb 6.4 oz (60.5 kg)   SpO2 96%   BMI 22.90 kg/m  Physical Exam   Assessment: *  Plan:   02/10/2024 11:22 AM   Disclaimer: This note was dictated with voice recognition software. Similar sounding words can inadvertently be transcribed and may not be corrected upon review.

## 2024-02-10 NOTE — Patient Instructions (Signed)
 We will schedule you for colonoscopy for colon cancer screening purposes.  I am going to give you samples of Linzess 290 mcg daily.  You may have an initial washout period with this medication, continue taking it, this should get better after a few days.  We do have room to decrease dose if this is too strong.  Linzess works best when taken once a day every day, on an empty stomach, at least 30 minutes before your first meal of the day.  When Linzess is taken daily as directed:  *Constipation relief is typically felt in about a week *IBS-C patients may begin to experience relief from belly pain and overall abdominal symptoms (pain, discomfort, and bloating) in about 1 week,   with symptoms typically improving over 12 weeks.  Diarrhea may occur in the first 2 weeks -keep taking it.  The diarrhea should go away and you should start having normal, complete, full bowel movements. It may be helpful to start treatment when you can be near the comfort of your own bathroom, such as a weekend.    It was very nice meeting you both today.  Dr. Marletta Lor

## 2024-02-11 ENCOUNTER — Encounter: Payer: Self-pay | Admitting: Internal Medicine

## 2024-02-18 ENCOUNTER — Ambulatory Visit (HOSPITAL_COMMUNITY): Admitting: Anesthesiology

## 2024-02-18 ENCOUNTER — Encounter (HOSPITAL_COMMUNITY): Payer: Self-pay | Admitting: Internal Medicine

## 2024-02-18 ENCOUNTER — Other Ambulatory Visit: Payer: Self-pay

## 2024-02-18 ENCOUNTER — Ambulatory Visit (HOSPITAL_COMMUNITY)
Admission: RE | Admit: 2024-02-18 | Discharge: 2024-02-18 | Disposition: A | Discharge status: Home / Self Care | Source: Ambulatory Visit | Attending: Internal Medicine | Admitting: Internal Medicine

## 2024-02-18 ENCOUNTER — Encounter (HOSPITAL_COMMUNITY)
Admission: RE | Disposition: A | Discharge status: Home / Self Care | Payer: Self-pay | Source: Ambulatory Visit | Attending: Internal Medicine

## 2024-02-18 DIAGNOSIS — Z1211 Encounter for screening for malignant neoplasm of colon: Secondary | ICD-10-CM | POA: Diagnosis not present

## 2024-02-18 DIAGNOSIS — K219 Gastro-esophageal reflux disease without esophagitis: Secondary | ICD-10-CM | POA: Diagnosis not present

## 2024-02-18 DIAGNOSIS — Z79899 Other long term (current) drug therapy: Secondary | ICD-10-CM | POA: Diagnosis not present

## 2024-02-18 DIAGNOSIS — K648 Other hemorrhoids: Secondary | ICD-10-CM

## 2024-02-18 DIAGNOSIS — D124 Benign neoplasm of descending colon: Secondary | ICD-10-CM

## 2024-02-18 DIAGNOSIS — K5909 Other constipation: Secondary | ICD-10-CM | POA: Insufficient documentation

## 2024-02-18 DIAGNOSIS — D120 Benign neoplasm of cecum: Secondary | ICD-10-CM | POA: Diagnosis not present

## 2024-02-18 DIAGNOSIS — K635 Polyp of colon: Secondary | ICD-10-CM | POA: Diagnosis not present

## 2024-02-18 HISTORY — PX: COLONOSCOPY: SHX5424

## 2024-02-18 HISTORY — PX: POLYPECTOMY: SHX5525

## 2024-02-18 SURGERY — COLONOSCOPY
Anesthesia: General

## 2024-02-18 MED ORDER — LINACLOTIDE 290 MCG PO CAPS: 290.0000 ug | ORAL_CAPSULE | Freq: Every day | ORAL | 3 refills | Status: AC

## 2024-02-18 MED ORDER — LACTATED RINGERS IV SOLN: INTRAVENOUS | Status: DC | PRN

## 2024-02-18 MED ORDER — PROPOFOL 500 MG/50ML IV EMUL
INTRAVENOUS | Status: DC | PRN
  Administered 2024-02-18: 150 ug/kg/min via INTRAVENOUS
  Administered 2024-02-18: 80 mg via INTRAVENOUS

## 2024-02-18 NOTE — Anesthesia Preprocedure Evaluation (Signed)
 Anesthesia Evaluation  Patient identified by MRN, date of birth, ID band Patient awake    Reviewed: Allergy & Precautions, H&P , NPO status , Patient's Chart, lab work & pertinent test results, reviewed documented beta blocker date and time   Airway Mallampati: II  TM Distance: >3 FB Neck ROM: full    Dental no notable dental hx. (+) Dental Advisory Given, Teeth Intact   Pulmonary neg pulmonary ROS   Pulmonary exam normal breath sounds clear to auscultation       Cardiovascular Exercise Tolerance: Good negative cardio ROS Normal cardiovascular exam Rhythm:regular Rate:Normal     Neuro/Psych Lumbar radiculopathy  Neuromuscular disease  negative psych ROS   GI/Hepatic Neg liver ROS,GERD  ,,  Endo/Other  negative endocrine ROS    Renal/GU negative Renal ROS  negative genitourinary   Musculoskeletal   Abdominal   Peds  Hematology negative hematology ROS (+)   Anesthesia Other Findings   Reproductive/Obstetrics negative OB ROS                             Anesthesia Physical Anesthesia Plan  ASA: 2  Anesthesia Plan: General   Post-op Pain Management: Minimal or no pain anticipated   Induction: Intravenous  PONV Risk Score and Plan: Propofol infusion  Airway Management Planned: Natural Airway and Nasal Cannula  Additional Equipment: None  Intra-op Plan:   Post-operative Plan:   Informed Consent: I have reviewed the patients History and Physical, chart, labs and discussed the procedure including the risks, benefits and alternatives for the proposed anesthesia with the patient or authorized representative who has indicated his/her understanding and acceptance.     Dental Advisory Given  Plan Discussed with: CRNA  Anesthesia Plan Comments:         Anesthesia Quick Evaluation

## 2024-02-18 NOTE — Discharge Instructions (Addendum)
 Colonoscopy Discharge Instructions  Read the instructions outlined below and refer to this sheet in the next few weeks. These discharge instructions provide you with general information on caring for yourself after you leave the hospital. Your doctor may also give you specific instructions. While your treatment has been planned according to the most current medical practices available, unavoidable complications occasionally occur.   ACTIVITY You may resume your regular activity, but move at a slower pace for the next 24 hours.  Take frequent rest periods for the next 24 hours.  Walking will help get rid of the air and reduce the bloated feeling in your belly (abdomen).  No driving for 24 hours (because of the medicine (anesthesia) used during the test).   Do not sign any important legal documents or operate any machinery for 24 hours (because of the anesthesia used during the test).  NUTRITION Drink plenty of fluids.  You may resume your normal diet as instructed by your doctor.  Begin with a light meal and progress to your normal diet. Heavy or fried foods are harder to digest and may make you feel sick to your stomach (nauseated).  Avoid alcoholic beverages for 24 hours or as instructed.  MEDICATIONS You may resume your normal medications unless your doctor tells you otherwise.  WHAT YOU CAN EXPECT TODAY Some feelings of bloating in the abdomen.  Passage of more gas than usual.  Spotting of blood in your stool or on the toilet paper.  IF YOU HAD POLYPS REMOVED DURING THE COLONOSCOPY: No aspirin products for 7 days or as instructed.  No alcohol for 7 days or as instructed.  Eat a soft diet for the next 24 hours.  FINDING OUT THE RESULTS OF YOUR TEST Not all test results are available during your visit. If your test results are not back during the visit, make an appointment with your caregiver to find out the results. Do not assume everything is normal if you have not heard from your  caregiver or the medical facility. It is important for you to follow up on all of your test results.  SEEK IMMEDIATE MEDICAL ATTENTION IF: You have more than a spotting of blood in your stool.  Your belly is swollen (abdominal distention).  You are nauseated or vomiting.  You have a temperature over 101.  You have abdominal pain or discomfort that is severe or gets worse throughout the day.   Your colonoscopy revealed 2 polyp(s) which I removed successfully. Await pathology results, my office will contact you. I recommend repeating colonoscopy in 7 years for surveillance purposes.   I am going to send in prescription for Linzess to your pharmacy.   Follow up in GI office in 3 months.  Linzess works best when taken once a day every day, on an empty stomach, at least 30 minutes before your first meal of the day.  When Linzess is taken daily as directed:  *Constipation relief is typically felt in about a week *IBS-C patients may begin to experience relief from belly pain and overall abdominal symptoms (pain, discomfort, and bloating) in about 1 week,   with symptoms typically improving over 12 weeks.  Diarrhea may occur in the first 2 weeks -keep taking it.  The diarrhea should go away and you should start having normal, complete, full bowel movements. It may be helpful to start treatment when you can be near the comfort of your own bathroom, such as a weekend.    I hope you have a  great rest of your week!  Hennie Duos. Marletta Lor, D.O. Gastroenterology [facility]

## 2024-02-18 NOTE — Op Note (Signed)
 [facility] Patient Name: Lindsay Wilson Procedure Date: 02/18/2024 8:16 AM MRN: [id number] Date of Birth: 03-Aug-1956 Attending MD: Hennie Duos. Marletta Lor , [place], [id number] CSN: [id number] Age: 68 Admit Type: Outpatient Procedure:                Colonoscopy Indications:              Screening for colorectal malignant neoplasm Providers:                Hennie Duos. Marletta Lor, DO, Nena Polio, RN, Lennice Sites                            Technician, Technician Referring MD:              Medicines:                See the Anesthesia note for documentation of the                            administered medications Complications:            No immediate complications. Estimated Blood Loss:     Estimated blood loss was minimal. Procedure:                Pre-Anesthesia Assessment:                           - The anesthesia plan was to use monitored                            anesthesia care (MAC).                           After obtaining informed consent, the colonoscope                            was passed under direct vision. Throughout the                            procedure, the patient's blood pressure, pulse, and                            oxygen saturations were monitored continuously. The                            [id number]-HQ190L ([id number]) scope was introduced through                            the anus and advanced to the the cecum, identified                            by appendiceal orifice and ileocecal valve. The                            colonoscopy was performed without difficulty. The                            patient tolerated the procedure  well. The quality                            of the bowel preparation was evaluated using the                            BBPS [facility] Bowel Preparation Scale) with scores                            of: Right Colon = 3, Transverse Colon = 3 and Left                            Colon = 3 (entire mucosa seen well with no residual                             staining, small fragments of stool or opaque                            liquid). The total BBPS score equals 9. Scope In: 8:27:28 AM Scope Out: 8:45:09 AM Scope Withdrawal Time: 0 hours 13 minutes 11 seconds  Total Procedure Duration: 0 hours 17 minutes 41 seconds  Findings:      Non-bleeding internal hemorrhoids were found. The hemorrhoids were small.      A 4 mm polyp was found in the cecum. The polyp was sessile. The polyp       was removed with a cold snare. Resection and retrieval were complete.      A 6 mm polyp was found in the descending colon. The polyp was sessile.       The polyp was removed with a cold snare. Resection and retrieval were       complete.      The exam was otherwise without abnormality. Impression:               - Non-bleeding internal hemorrhoids.                           - One 4 mm polyp in the cecum, removed with a cold                            snare. Resected and retrieved.                           - One 6 mm polyp in the descending colon, removed                            with a cold snare. Resected and retrieved.                           - The examination was otherwise normal. Moderate Sedation:      Per Anesthesia Care Recommendation:           - Patient has a contact number available for                            emergencies. The signs  and symptoms of potential                            delayed complications were discussed with the                            patient. Return to normal activities tomorrow.                            Written discharge instructions were provided to the                            patient.                           - Resume previous diet.                           - Continue present medications.                           - Await pathology results.                           - Repeat colonoscopy in 7 years for surveillance if                            benefits outweigh risks.                           - Return to  GI clinic in 3 months. Procedure Code(s):        --- Professional ---                           [phone], Colonoscopy, flexible; with removal of                            tumor(s), polyp(s), or other lesion(s) by snare                            technique Diagnosis Code(s):        --- Professional ---                           Z12.11, Encounter for screening for malignant                            neoplasm of colon                           D12.0, Benign neoplasm of cecum                           D12.4, Benign neoplasm of descending colon                           K64.8, Other hemorrhoids CPT copyright 2022 American Medical Association.  All rights reserved. The codes documented in this report are preliminary and upon coder review may  be revised to meet current compliance requirements. Hennie Duos. Marletta Lor, DO Hennie Duos. Marletta Lor, DO 02/18/2024 8:48:05 AM This report has been signed electronically. Number of Addenda: 0

## 2024-02-18 NOTE — Interval H&P Note (Signed)
 History and Physical Interval Note:  02/18/2024 7:58 AM  Lindsay Wilson  has presented today for surgery, with the diagnosis of ca screen.  The various methods of treatment have been discussed with the patient and family. After consideration of risks, benefits and other options for treatment, the patient has consented to  Procedure(s) with comments: COLONOSCOPY (N/A) - 830AM, ASA 2 as a surgical intervention.  The patient's history has been reviewed, patient examined, no change in status, stable for surgery.  I have reviewed the patient's chart and labs.  Questions were answered to the patient's satisfaction.     [name]

## 2024-02-18 NOTE — Transfer of Care (Signed)
 Immediate Anesthesia Transfer of Care Note  Patient: Lindsay Wilson  Procedure(s) Performed: COLONOSCOPY POLYPECTOMY  Patient Location: Endoscopy Unit  Anesthesia Type:General  Level of Consciousness: awake  Airway & Oxygen Therapy: Patient Spontanous Breathing  Post-op Assessment: Report given to RN and Post -op Vital signs reviewed and stable  Post vital signs: Reviewed and stable  Last Vitals:  Vitals Value Taken Time  BP 94/45   Temp    Pulse 77   Resp 19   SpO2 98%     Last Pain:  Vitals:   02/18/24 0826  TempSrc:   PainSc: 0-No pain      Patients Stated Pain Goal: 10 (02/18/24 0722)  Complications: No notable events documented.

## 2024-02-18 NOTE — Anesthesia Postprocedure Evaluation (Signed)
 Anesthesia Post Note  Patient: Lindsay Wilson  Procedure(s) Performed: COLONOSCOPY POLYPECTOMY  Patient location during evaluation: PACU Anesthesia Type: General Level of consciousness: awake and alert Pain management: pain level controlled Vital Signs Assessment: post-procedure vital signs reviewed and stable Respiratory status: spontaneous breathing, nonlabored ventilation, respiratory function stable and patient connected to nasal cannula oxygen Cardiovascular status: blood pressure returned to baseline and stable Postop Assessment: no apparent nausea or vomiting Anesthetic complications: no   There were no known notable events for this encounter.   Last Vitals:  Vitals:   02/18/24 0849 02/18/24 0852  BP: (!) 98/45 (!) 109/55  Pulse: 72 71  Resp: 16   Temp: 36.6 C   SpO2: 98%     Last Pain:  Vitals:   02/18/24 0849  TempSrc: Axillary  PainSc: 0-No pain                 Abiageal Blowe [name]

## 2024-02-21 ENCOUNTER — Encounter (HOSPITAL_COMMUNITY): Payer: Self-pay | Admitting: Internal Medicine

## 2024-02-21 LAB — SURGICAL PATHOLOGY

## 2024-03-27 ENCOUNTER — Other Ambulatory Visit (HOSPITAL_COMMUNITY): Payer: Self-pay | Admitting: Family Medicine

## 2024-03-27 DIAGNOSIS — Z1231 Encounter for screening mammogram for malignant neoplasm of breast: Secondary | ICD-10-CM

## 2024-05-01 ENCOUNTER — Ambulatory Visit (HOSPITAL_COMMUNITY)
Admission: RE | Admit: 2024-05-01 | Discharge: 2024-05-01 | Disposition: A | Discharge status: Home / Self Care | Source: Ambulatory Visit | Attending: Family Medicine | Admitting: Family Medicine

## 2024-05-01 DIAGNOSIS — Z1231 Encounter for screening mammogram for malignant neoplasm of breast: Secondary | ICD-10-CM | POA: Diagnosis not present

## 2024-05-09 ENCOUNTER — Encounter: Payer: Self-pay | Admitting: Internal Medicine

## 2024-05-09 DIAGNOSIS — L905 Scar conditions and fibrosis of skin: Secondary | ICD-10-CM | POA: Diagnosis not present

## 2024-05-09 DIAGNOSIS — L739 Follicular disorder, unspecified: Secondary | ICD-10-CM | POA: Diagnosis not present

## 2024-05-09 DIAGNOSIS — D485 Neoplasm of uncertain behavior of skin: Secondary | ICD-10-CM | POA: Diagnosis not present

## 2024-05-24 DIAGNOSIS — Z1329 Encounter for screening for other suspected endocrine disorder: Secondary | ICD-10-CM | POA: Diagnosis not present

## 2024-05-24 DIAGNOSIS — I10 Essential (primary) hypertension: Secondary | ICD-10-CM | POA: Diagnosis not present

## 2024-05-24 DIAGNOSIS — R739 Hyperglycemia, unspecified: Secondary | ICD-10-CM | POA: Diagnosis not present

## 2024-05-24 DIAGNOSIS — E7800 Pure hypercholesterolemia, unspecified: Secondary | ICD-10-CM | POA: Diagnosis not present

## 2024-05-24 DIAGNOSIS — E7849 Other hyperlipidemia: Secondary | ICD-10-CM | POA: Diagnosis not present

## 2024-05-25 DIAGNOSIS — R739 Hyperglycemia, unspecified: Secondary | ICD-10-CM | POA: Diagnosis not present

## 2024-05-25 DIAGNOSIS — K5909 Other constipation: Secondary | ICD-10-CM | POA: Diagnosis not present

## 2024-05-25 DIAGNOSIS — E782 Mixed hyperlipidemia: Secondary | ICD-10-CM | POA: Diagnosis not present

## 2024-05-25 DIAGNOSIS — Z6821 Body mass index (BMI) 21.0-21.9, adult: Secondary | ICD-10-CM | POA: Diagnosis not present

## 2024-05-25 DIAGNOSIS — Z0000 Encounter for general adult medical examination without abnormal findings: Secondary | ICD-10-CM | POA: Diagnosis not present

## 2024-07-04 DIAGNOSIS — I83813 Varicose veins of bilateral lower extremities with pain: Secondary | ICD-10-CM | POA: Diagnosis not present

## 2024-07-04 DIAGNOSIS — M79662 Pain in left lower leg: Secondary | ICD-10-CM | POA: Diagnosis not present

## 2024-07-04 DIAGNOSIS — R600 Localized edema: Secondary | ICD-10-CM | POA: Diagnosis not present

## 2024-07-04 DIAGNOSIS — M79661 Pain in right lower leg: Secondary | ICD-10-CM | POA: Diagnosis not present

## 2024-07-04 DIAGNOSIS — R252 Cramp and spasm: Secondary | ICD-10-CM | POA: Diagnosis not present

## 2024-08-01 DIAGNOSIS — I83891 Varicose veins of right lower extremities with other complications: Secondary | ICD-10-CM | POA: Diagnosis not present

## 2024-08-01 DIAGNOSIS — I83811 Varicose veins of right lower extremities with pain: Secondary | ICD-10-CM | POA: Diagnosis not present

## 2024-08-02 DIAGNOSIS — I83812 Varicose veins of left lower extremities with pain: Secondary | ICD-10-CM | POA: Diagnosis not present

## 2024-08-02 DIAGNOSIS — M7989 Other specified soft tissue disorders: Secondary | ICD-10-CM | POA: Diagnosis not present

## 2024-08-02 DIAGNOSIS — I83892 Varicose veins of left lower extremities with other complications: Secondary | ICD-10-CM | POA: Diagnosis not present

## 2024-08-04 DIAGNOSIS — Z6821 Body mass index (BMI) 21.0-21.9, adult: Secondary | ICD-10-CM | POA: Diagnosis not present

## 2024-08-04 DIAGNOSIS — Z1331 Encounter for screening for depression: Secondary | ICD-10-CM | POA: Diagnosis not present

## 2024-08-04 DIAGNOSIS — Z1389 Encounter for screening for other disorder: Secondary | ICD-10-CM | POA: Diagnosis not present

## 2024-08-04 DIAGNOSIS — Z0001 Encounter for general adult medical examination with abnormal findings: Secondary | ICD-10-CM | POA: Diagnosis not present

## 2024-08-22 DIAGNOSIS — I83892 Varicose veins of left lower extremities with other complications: Secondary | ICD-10-CM | POA: Diagnosis not present

## 2024-08-22 DIAGNOSIS — I83812 Varicose veins of left lower extremities with pain: Secondary | ICD-10-CM | POA: Diagnosis not present

## 2024-09-14 ENCOUNTER — Encounter: Payer: Self-pay | Admitting: Internal Medicine

## 2024-09-25 DIAGNOSIS — Z961 Presence of intraocular lens: Secondary | ICD-10-CM | POA: Diagnosis not present

## 2024-09-25 DIAGNOSIS — H538 Other visual disturbances: Secondary | ICD-10-CM | POA: Diagnosis not present

## 2024-09-29 DIAGNOSIS — M79662 Pain in left lower leg: Secondary | ICD-10-CM | POA: Diagnosis not present
